# Patient Record
Sex: Male | Born: 1955 | Race: White | Hispanic: No | Smoking: Never smoker
Health system: Southern US, Community
[De-identification: ages and names within clinical notes are randomized; demographics above are authoritative.]

## PROBLEM LIST (undated history)

## (undated) DIAGNOSIS — N200 Calculus of kidney: Secondary | ICD-10-CM

## (undated) DIAGNOSIS — D126 Benign neoplasm of colon, unspecified: Secondary | ICD-10-CM

## (undated) DIAGNOSIS — K7460 Unspecified cirrhosis of liver: Secondary | ICD-10-CM

## (undated) HISTORY — DX: Benign neoplasm of colon, unspecified: D12.6

## (undated) HISTORY — DX: Unspecified cirrhosis of liver: K74.60

## (undated) HISTORY — DX: Calculus of kidney: N20.0

## (undated) HISTORY — PX: LITHOTRIPSY: SUR834

## (undated) HISTORY — PX: LIVER BIOPSY: SHX301

## (undated) HISTORY — PX: ROTATOR CUFF REPAIR: SHX139

---

## 2004-11-18 ENCOUNTER — Ambulatory Visit (HOSPITAL_COMMUNITY): Admission: RE | Admit: 2004-11-18 | Discharge: 2004-11-18 | Payer: Self-pay | Admitting: Orthopedic Surgery

## 2004-11-18 ENCOUNTER — Ambulatory Visit (HOSPITAL_BASED_OUTPATIENT_CLINIC_OR_DEPARTMENT_OTHER): Admission: RE | Admit: 2004-11-18 | Discharge: 2004-11-18 | Payer: Self-pay | Admitting: Orthopedic Surgery

## 2009-01-13 ENCOUNTER — Ambulatory Visit (HOSPITAL_BASED_OUTPATIENT_CLINIC_OR_DEPARTMENT_OTHER): Admission: RE | Admit: 2009-01-13 | Discharge: 2009-01-14 | Payer: Self-pay | Admitting: Orthopedic Surgery

## 2009-03-18 ENCOUNTER — Encounter: Admission: RE | Admit: 2009-03-18 | Discharge: 2009-03-18 | Payer: Self-pay | Admitting: Orthopedic Surgery

## 2009-05-26 ENCOUNTER — Encounter: Payer: Self-pay | Admitting: Gastroenterology

## 2009-07-17 ENCOUNTER — Encounter: Payer: Self-pay | Admitting: Gastroenterology

## 2009-07-28 ENCOUNTER — Encounter: Payer: Self-pay | Admitting: Gastroenterology

## 2009-12-02 ENCOUNTER — Encounter (INDEPENDENT_AMBULATORY_CARE_PROVIDER_SITE_OTHER): Payer: Self-pay | Admitting: *Deleted

## 2010-01-13 ENCOUNTER — Ambulatory Visit: Payer: Self-pay | Admitting: Gastroenterology

## 2010-01-13 DIAGNOSIS — K7460 Unspecified cirrhosis of liver: Secondary | ICD-10-CM | POA: Insufficient documentation

## 2010-01-14 LAB — CONVERTED CEMR LAB
ALT: 43 units/L (ref 0–53)
AST: 65 units/L — ABNORMAL HIGH (ref 0–37)
Albumin: 3.3 g/dL — ABNORMAL LOW (ref 3.5–5.2)
BUN: 11 mg/dL (ref 6–23)
Basophils Absolute: 0 10*3/uL (ref 0.0–0.1)
CO2: 27 meq/L (ref 19–32)
Calcium: 9.3 mg/dL (ref 8.4–10.5)
Chloride: 103 meq/L (ref 96–112)
GFR calc non Af Amer: 97.7 mL/min (ref >60)
HCT: 42.2 % (ref 39.0–52.0)
INR: 1.1 — ABNORMAL HIGH (ref 0.8–1.0)
Lymphs Abs: 1.2 10*3/uL (ref 0.7–4.0)
MCV: 93.5 fL (ref 78.0–100.0)
Monocytes Absolute: 0.6 10*3/uL (ref 0.1–1.0)
Platelets: 65 10*3/uL — ABNORMAL LOW (ref 150.0–400.0)
Potassium: 4.3 meq/L (ref 3.5–5.1)
RDW: 14.2 % (ref 11.5–14.6)

## 2010-01-16 LAB — CONVERTED CEMR LAB: Hep A Total Ab: NEGATIVE

## 2010-01-21 ENCOUNTER — Telehealth: Payer: Self-pay | Admitting: Gastroenterology

## 2010-02-02 ENCOUNTER — Ambulatory Visit: Payer: Self-pay | Admitting: Internal Medicine

## 2010-02-08 ENCOUNTER — Encounter: Admission: RE | Admit: 2010-02-08 | Discharge: 2010-02-08 | Payer: Self-pay | Admitting: Gastroenterology

## 2010-02-10 ENCOUNTER — Telehealth (INDEPENDENT_AMBULATORY_CARE_PROVIDER_SITE_OTHER): Payer: Self-pay | Admitting: *Deleted

## 2010-02-12 ENCOUNTER — Telehealth (INDEPENDENT_AMBULATORY_CARE_PROVIDER_SITE_OTHER): Payer: Self-pay | Admitting: *Deleted

## 2010-02-12 ENCOUNTER — Encounter (INDEPENDENT_AMBULATORY_CARE_PROVIDER_SITE_OTHER): Payer: Self-pay | Admitting: *Deleted

## 2010-02-24 ENCOUNTER — Ambulatory Visit: Payer: Self-pay | Admitting: Gastroenterology

## 2010-02-24 ENCOUNTER — Telehealth: Payer: Self-pay | Admitting: Gastroenterology

## 2010-03-03 ENCOUNTER — Ambulatory Visit: Payer: Self-pay | Admitting: Gastroenterology

## 2010-03-19 ENCOUNTER — Telehealth: Payer: Self-pay | Admitting: Gastroenterology

## 2010-03-19 ENCOUNTER — Ambulatory Visit
Admission: RE | Admit: 2010-03-19 | Discharge: 2010-03-19 | Payer: Self-pay | Source: Home / Self Care | Attending: Gastroenterology | Admitting: Gastroenterology

## 2010-03-29 ENCOUNTER — Encounter: Payer: Self-pay | Admitting: Gastroenterology

## 2010-04-07 NOTE — Progress Notes (Signed)
  Phone Note Other Incoming   Request: Send information Summary of Call: Records received from [facility]. 4 pages forwarded to Dr. Christella Hartigan for review.

## 2010-04-07 NOTE — Letter (Signed)
Summary: New Patient letter  [facility]  [address] [place], [place] [address]   Phone: [phone]  Fax: [phone]       12/02/2009 MRN: [id number]  SMITH POTENZA [address] [place], [place]  [address]  Dear Mr. BORDONARO,  Welcome to the Gastroenterology Division at [facility]    You are scheduled to see Dr.  Christella Hartigan  on 01/13/10  at 2:15 pm on the 3rd floor at [facility], [address]  We ask that you try to arrive at our office 15 minutes prior to your appointment time to allow for check-in.  We would like you to complete the enclosed self-administered evaluation form prior to your visit and bring it with you on the day of your appointment.  We will review it with you.  Also, please bring a complete list of all your medications or, if you prefer, bring the medication bottles and we will list them.  Please bring your insurance card so that we may make a copy of it.  If your insurance requires a referral to see a specialist, please bring your referral form from your primary care physician.  Co-payments are due at the time of your visit and may be paid by cash, check or credit card.     Your office visit will consist of a consult with your physician (includes a physical exam), any laboratory testing he/she may order, scheduling of any necessary diagnostic testing (e.g. x-ray, ultrasound, CT-scan), and scheduling of a procedure (e.g. Endoscopy, Colonoscopy) if required.  Please allow enough time on your schedule to allow for any/all of these possibilities.    If you cannot keep your appointment, please call [phone] to cancel or reschedule prior to your appointment date.  This allows [place] the opportunity to schedule an appointment for another patient in need of care.  If you do not cancel or reschedule by 5 p.m. the business day prior to your appointment date, you will be charged a $50.00 late cancellation/no-show fee.    Thank you for  choosing [facility] for your medical needs.  We appreciate the opportunity to care for you.  Please visit [place] at our website  to learn more about our practice.                     Sincerely,                                                             The Gastroenterology Division

## 2010-04-07 NOTE — Progress Notes (Signed)
Summary: CAT scan today  Phone Note Call from Patient Call back at Home Phone [phone]   Caller: Patient Call For: Dr. Christella Hartigan Reason for Call: Talk to Nurse Details for Reason: CAT Scan Today Summary of Call: Pt. called stating he could not go to CAT scan scheduled for 10:00 a.m. today as his boss would not let him off work.  Please call to reschedule. Initial call taken by: Schuyler Amor,  January 21, 2010 8:08 AM  Follow-up for Phone Call        pt calling to inform that he has toreschedule the appt at [facility] and he will call after he reschedules Follow-up by: Chales Abrahams CMA Duncan Dull),  January 21, 2010 9:00 AM

## 2010-04-07 NOTE — Letter (Signed)
Summary: Results Letter  [facility]  [address] [place], [place] [address]   Phone: [phone]  Fax: [phone]        February 12, 2010 MRN: [id number]    Bradley Green  [birth year] [address] [place], [place]  [address] [phone]   To whom it may concern:  Please send slides from Liver Biopsy MRN [id number]  Dated 05/26/09 to  [facility] Dept of Pathology [address] [place] [address] ATTN [place] Rund MD.  Please include all lab testing available with core biopsy.  [phone] is the contact phone number with the pathology dept.  Any questions please call Dr Rob Bunting office at [phone] and ask for Patty.  Thank you for your help.        Sincerely,  Bradley Green CMA (AAMA)  This letter has been electronically signed by your physician.  Appended Document: Results Letter faxed to [facility] [phone]

## 2010-04-07 NOTE — Procedures (Signed)
Summary: Colonoscopy & EGD/[facility]  Colonoscopy & EGD/[facility]   Imported By: Sherian Rein 01/16/2010 14:29:15  _____________________________________________________________________  External Attachment:    Type:   Image     Comment:   External Document

## 2010-04-07 NOTE — Letter (Signed)
Summary: [facility]  [facility]   Imported By: Sherian Rein 01/16/2010 14:26:59  _____________________________________________________________________  External Attachment:    Type:   Image     Comment:   External Document

## 2010-04-07 NOTE — Assessment & Plan Note (Signed)
History of Present Illness Visit Type: Initial Visit Primary GI MD: Rob Bunting MD Primary Provider: Dustin Flock ([place], [place]) Chief Complaint: Patient here to discuss liver disaease. He c/o some sharp upper abdominal pain. History of Present Illness:      found to have low platelets, less than 50K or so. Was having kidney stone problems.  Stone retrieval eventually by a urologist.  Was told at some point that he was either bleeding or had a cancer.    Underwent a liver bx: 2011 [facility] in [place].  Complicated by chest pains.  The liver biopsy suggested cirrhosis.    Never heard of liver troubles previously.  Never jaundice.  Had hepatitis many years ago (20 years ago blood donation was declined).    Was a drug, alcohol abuser (methampetamines).  In 30's used to drink quite a lot.  Cut back dramatically 20 years ago.  Never used IV drugs.  Tatoo's 10 years ago.  Never had blood transfusion.  labs may 2011: alpha-fetoprotein 3.3, anti-smooth muscle antibody normal, ceruloplasmin normal, alpha-1 antitrypsin level normal, ANA he positive ( speckled pattern) 1:160 titer, INR normal hepatitis A. he antibody IgM negative, hepatitis B. surface antigen negative, hepatitis B core antibody negative, hepatitis C virus antibody negative. AST 99, ALT 53, alkaline phosphatase 159, bilirubin normal. Hemoglobin 14.7, platelets 81,000.  EGD may 2011 was normal, no varices. Colonoscopy may 2011 found 3 small polyps, 2 of them were adenomas.             Current Medications (verified): 1)  Glucotrol 10 Mg Tabs (Glipizide) .Marland Kitchen.. 1 By Mouth Two Times A Day 2)  Lantus 100 Unit/ml Soln (Insulin Glargine) .Marland Kitchen.. 10 Units Every Morning 3)  Humalog 100 Unit/ml Soln (Insulin Lispro (Human)) .... Sliding Scale 4)  Aspirin 81 Mg Tbec (Aspirin) .... Take 1 Tablet By Mouth Once A Day  Allergies (verified): 1)  ! Morphine 2)  ! Darvocet 3)  ! Codeine  Past History:  Past  Medical History: Kidney Stones Diabetes Cirrhosis based on liver biopsy 2011, unclear etiology to date Adenomatous colon polyps May 2011  Past Surgical History: Liver Biopsy 2011 Rotator Cuff Repair kidney stone lithotripsy  Family History: no colon cancer  Social History: he has one son, he works as a Curator, he does not smoke cigarettes, he no longer drinks alcohol, he drinks 4 caffeinated beverages per day.  Review of Systems       Pertinent positive and negative review of systems were noted in the above HPI and GI specific review of systems.  All other review of systems was otherwise negative.   Vital Signs:  Patient profile:   55 year old male Height:      67 inches Weight:      220.25 pounds BMI:     34.62 BSA:     2.11 Pulse rate:   72 / minute Pulse rhythm:   regular BP sitting:   100 / 64  (left arm)  Vitals Entered By: Lamona Curl CMA Duncan Dull) (January 13, 2010 1:20 PM)  Physical Exam  Additional Exam:  Constitutional: generally well appearing Psychiatric: alert and oriented times 3 Eyes: extraocular movements intact Mouth: oropharynx moist, no lesions Neck: supple, no lymphadenopathy Cardiovascular: heart regular rate and rythm Lungs: CTA bilaterally Abdomen: soft, non-tender, non-distended, no obvious ascites, no peritoneal signs, normal bowel sounds Extremities: no lower extremity edema bilaterally Skin: no lesions on visible extremities    Impression & Recommendations:  Problem #  1:  Chronic liver disease we will get liver biopsy report sent over for review, may need to have the slides sent here for our pathologist to review. He has ANA titer 1:160 speckled pattern, possibly underlying autoimmune hepatitis. Otherwise no clear etiology. I've gone over general guideline recommendations for cirrhosis, see those summarized below. He has not had imaging studies in the past year that I can tell so we will set him up with an abdominal CT scan check  for cirrhosis, discrete liver lesions. He return here in 5-6 weeks to discuss all results. We will also begin immunizations for hepatitis A and B.  Problem # 2:  precancerous colon polyps repeat colonoscopy at five-year interval.  Other Orders: CT Abdomen/Pelvis w & w/o Contrast (CT A/P w&w/o Cont) TLB-CMP (Comprehensive Metabolic Pnl) (80053-COMP) TLB-PT (Protime) (85610-PTP) TLB-CBC Platelet - w/Differential (85025-CBCD)  Patient Instructions: 1)  We will get report from liver biopsy done earlier this year [facility] [place]).  May need to have the slides sent here for pathology review. 2)  You will be scheduled for a CT scan of abdomen and pelvis with IV and oral contrast to check for liver cirrhosis, discrete liver lesions. 3)  You will get lab test(s) done today (cmet, inr, cbc) 4)  Avoid NSAID otc pain meds. 5)  Tylenol is safe, only at 1/2 the usual recommended doses. 6)  Low salt diet is best. 7)  Avoid alcohol as best that you can. 8)  Immunization for Hepatitis A and B series of shots started today. 9)  Return to see Dr. Christella Hartigan in 5-6 weeks. 10)  Recall colonoscopy in May 2016 for adenomatous polyps. 11)  A copy of this information will be sent to Dr. Livingston Diones. 12)  The medication list was reviewed and reconciled.  All changed / newly prescribed medications were explained.  A complete medication list was provided to the patient / caregiver.  Appended Document: Orders Update/lab    Clinical Lists Changes  Orders: Added new Test order of T-Hepatitis A Antibody ([id number]) - Signed      Appended Document: recall colon    Clinical Lists Changes  Observations: Added new observation of COLONNXTDUE: 07/2014 (01/13/2010 14:08)      Appended Document:  Verbal order to NOT start Twinrix today will start if needed after hep a antibody  Appended Document:  iron studies (ferritin, tibc, total iron were all normal 2011)  Appended Document:  actually had 3  adenomatous polyps, needs recall colonoscopy May 2014  Appended Document: recall colon correction    Clinical Lists Changes  Observations: Added new observation of COLONNXTDUE: 07/2012 (01/13/2010 14:36) Removed observation of COLONNXTDUE: 07/2014 (01/13/2010 14:08)      Appended Document:  recall corrected

## 2010-04-07 NOTE — Progress Notes (Signed)
Summary: Pathology slides request  Phone Note Outgoing Call   Call placed by: Chales Abrahams CMA Duncan Dull),  February 12, 2010 2:27 PM Summary of Call: placed a call to [facility]ology to have slides sent to [facility] I spoke with Lyda Jester and he advised to fax request with information where to send.  I will get that faxed today.  [phone] fax   Initial call taken by: Chales Abrahams CMA Duncan Dull),  February 12, 2010 2:29 PM

## 2010-04-09 NOTE — Progress Notes (Signed)
Summary: Slides  Phone Note Outgoing Call   Summary of Call: call placd to Karena Addison at [facility] path and the slides will be reviewed tomorrow by Dr Frederica Kuster Initial call taken by: Chales Abrahams CMA Duncan Dull),  February 24, 2010 2:18 PM  Follow-up for Phone Call        ok Follow-up by: Rachael Fee MD,  February 25, 2010 7:36 AM     Appended Document: Slides SP-Surgical Pathology - STATUS: Final  .                                         Perform Date: 20Dec11 00:01  Ordered By: Hoy Finlay MD , PROVIDER         Ordered Date:  Facility: [facility]                              Department: CPATH  Service Report Text  [facility]   [address]   [place] , [place] [address]   Telephone : [phone] Fax : [phone]    REPORT OF SURGICAL PATHOLOGY    Accession #: [id number]   Patient Name: GENESIS, PAGET   Visit # :    MRN: [id number]   Physician: Rob Bunting   DOB/Age 10/08/1955 (Age: 55) Gender: M   Collected Date: 02/24/2010   Received Date: 02/24/2010    FINAL DIAGNOSIS    1. 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 Outside H and E and special stain slides and the H and E slides, trichrome,   PAS and Iron stains preformed here at [facility] are   reviewed. On review, there is macrovesicular steatosis occupying approximately   70% of the hepatic parenchyma with associated ballooned hepatocytes and focal   neutrophilic  inflammation. There is occasional Mallory hyalin deposition.   Trichrome stain identifies extensive perisinusoidal fibrosis associated with the   steatosis, extensive bridging fibrosis and regenerative micro-nodular hepatocyte   formation. Reticulin stain demonstrates disruption of the reticulin network but   no increased hepatocyte cell plate thickness. There is a rare portal area   identified. Within the areas of fibrosis, there is an extensive cholangioloar   proliferation associated with moderate mixed inflammation consisting of   lymphocytes, neutrophils and rare plasma cells and eosinophils. There is focal   to patchy interface inflammation present. No central veins are identified within   the regenerative hepatocyte nodules. There are scattered inflammatory foci   present in the regenerative nodules. Given the history of ethanol usage, and   absence of significant serologic findings, the overall clinicopathologic   features are that of steatohepatitis associated with cirrhotic type fibrosis and   not autoimmune hepatitis. The isolated elevated ANA is highly nonspecific and   is known to occur with steatohepatitis. The case was discussed with Dr. Christella Hartigan   on 02/25/2010. (  CR:kv 02/26/10)    CASE COMMENTS   STAINS USED IN DIAGNOSIS:   Iron Stain   Masson's Trichrome Stain   PAS/H w/Dig    CLINICAL HISTORY    SPECIMEN(S) OBTAINED   1. Consult- Comprehensive, Liver, Needle Biopsy    SPECIMEN COMMENTS:   SPECIMEN CLINICAL INFORMATION:   1. [facility] Pathology # [id number] (isv)    Gross Description  1. Received from [facility] are 8 slides and 1 block labeled   [id number] accompanied by a corresponding report. The report indicates the   specimen represents Liver Biopsy on the above patient. The slides and block are   returned after completion of our studies. (CRR,isv)    Report signed out from the following location(s)   [facility]   [facility]   [address] [place], [place] [address] CLIA #: [id number]    [facility]   [address] [place], [place] [address] CLIA #: [id number]  Additional Information  HL7 RESULT STATUS : F  External IF Update Timestamp : 2010-02-26:16:24:00.000000  Appended Document: Slides i talked with him about biopsy path second opinion.  unlikely that he has underlying AIH, he was drinking (but very sporadically) at the time of the biopsy last march.

## 2010-04-09 NOTE — Assessment & Plan Note (Signed)
Summary: Hoy Register #3/KS  Nurse Visit   Allergies: 1)  ! Morphine 2)  ! Darvocet 3)  ! Codeine  Immunizations Administered:  TwinRix # 3:    Vaccine Type: TwinRix    Site: right deltoid    Mfr: GlaxoSmithKline    Dose: 1.0 ml    Route: IM    Given by: Christie Nottingham CMA (AAMA)    Exp. Date: 01/09/2012    Lot #: [id number]    VIS given: 11/24/06 version given March 19, 2010. Pt will be due for next Twinrix injection in one year. Sent a flag to Patty for pt to have booster in one year.

## 2010-04-09 NOTE — Assessment & Plan Note (Signed)
Summary: twin rix #2 /pl  Nurse Visit   Allergies: 1)  ! Morphine 2)  ! Darvocet 3)  ! Codeine  Immunizations Administered:  TwinRix # 2:    Vaccine Type: TwinRix    Site: left deltoid    Mfr: GlaxoSmithKline    Dose: 1.0 ml    Route: IM    Given by: Ok Anis CMA    Exp. Date: 01/09/2012    Lot #: [id number]    VIS given: 11/24/06 version given March 03, 2010.  Orders Added: 1)  TwinRix 1ml ( Hep A&B Adult dose) [90636] 2)  Admin 1st Vaccine [90471]

## 2010-04-09 NOTE — Assessment & Plan Note (Signed)
Review of gastrointestinal problems: 1. cirrhosis (unclear cause 12/11, possible ANA, possible alcohol) pha-fetoprotein 3.3, anti-smooth muscle antibody normal, ceruloplasmin normal, alpha-1 antitrypsin level normal, ANA he positive ( speckled pattern) 1:160 titer, INR normal hepatitis A. he antibody IgM negative, hepatitis B. surface antigen negative, hepatitis B core antibody negative, hepatitis C virus antibody negative. AST 99, ALT 53, alkaline phosphatase 159, bilirubin normal. Hemoglobin 14.7, platelets 81,000.  EGD may 2011 was normal, no varices.  colonoscopy may 2011 found 3 small polyps, 2 of them were adenomas. CT 11/11 cirrhosis, (Several small lesions that were followup by MRI 11/11 and this suggested regenerative nodules onlym, no clear hepatocellular cancer)   History of Present Illness Visit Type: Follow-up Visit Primary GI MD: Rob Bunting MD Primary Provider: Dustin Flock ([place], [place]) Requesting Provider: n/a Chief Complaint: 6 week follow up History of Present Illness:     very pleasant 55 year old mansince last visit about 6 weeks ago, he has been worried. But overall doing well.  he passed a kidney stone this past weekend (not uncommon for him).   he had CT scan and MRI, see those results summarized above. We have not yet received second opinion pathology from his liver biopsy.  no overt Gi bleeding, weight stable.             Current Medications (verified): 1)  Glucotrol 10 Mg Tabs (Glipizide) .Marland Kitchen.. 1 By Mouth Two Times A Day 2)  Lantus 100 Unit/ml Soln (Insulin Glargine) .Marland Kitchen.. 10 Units Every Morning 3)  Humalog 100 Unit/ml Soln (Insulin Lispro (Human)) .... Sliding Scale 4)  Aspirin 81 Mg Tbec (Aspirin) .... Take 1 Tablet By Mouth Once A Day 5)  Hctz 5mg  .... 1 By Mouth Every Am  Allergies (verified): 1)  ! Morphine 2)  ! Darvocet 3)  ! Codeine  Vital Signs:  Patient profile:   55 year old male Height:      67 inches Weight:      222  pounds BMI:     34.90 BSA:     2.12 Pulse rate:   74 / minute Pulse rhythm:   regular BP sitting:   108 / 80  (left arm)  Vitals Entered By: Merri Ray CMA Duncan Dull) (February 24, 2010 1:44 PM)  Physical Exam  Additional Exam:  Constitutional: generally well appearing Psychiatric: alert and oriented times 3 Abdomen: soft, non-tender, non-distended, normal bowel sounds, no obvious ascites Extremities no lower extremity edema   Impression & Recommendations:  Problem # 1:  CIRRHOSIS (ICD-571.5) unclear etiology, alcohol or autoimmune liver disease. We will again try to get second opinion pathology on his previous liver biopsy. He has not had alcohol in many years her he if the biopsies however suggest an autoimmune process we could start him on steroids and immunomodulators. He does have low platelets, no varices on outside EGD, otherwise he has well compensated cirrhosis. He will return to see me in 6 months and sooner if needed.  Patient Instructions: 1)  We will check for pathology about the slides we requested from previous liver biopsy.  2)  Will start Hep A/B immunization series today. 3)  Avoid NSAIDs. 4)  Should be on a low salt diet. 5)  Return to see Dr. Christella Hartigan in 6 months, sooner if needed. 6)  A copy of this information will be sent to Dr. Heriberto Antigua ([place]). 7)  The medication list was reviewed and reconciled.  All changed / newly prescribed medications were explained.  A complete medication list was provided  to the patient / caregiver.  Appended Document: Twin Rix #1    Clinical Lists Changes  Orders: Added new Service order of TwinRix 1ml ( Hep A&B Adult dose) (40981) - Signed Added new Service order of Admin 1st Vaccine (19147) - Signed Observations: Added new observation of WGNFAOZ3 VIS: 11/24/06 version given February 24, 2010. (02/24/2010 14:14) Added new observation of TWINRIX1LOT#: [id number] (02/24/2010 14:14) Added new observation of TWINRIX1EXP:  01/09/2012 (02/24/2010 14:14) Added new observation of TWINRIX1BY: Chales Abrahams CMA (AAMA) (02/24/2010 14:14) Added new observation of EXBMWUX3KGMW: IM (02/24/2010 14:14) Added new observation of TWINRIX1DOSE: 1.0 ml (02/24/2010 14:14) Added new observation of NUUVOZD6 MFR: GlaxoSmithKline (02/24/2010 14:14) Added new observation of TWINRIX1SITE: left deltoid (02/24/2010 14:14) Added new observation of TWINRIX1GIVN: TwinRix (02/24/2010 14:14)       Immunizations Administered:  TwinRix # 1:    Vaccine Type: TwinRix    Site: left deltoid    Mfr: GlaxoSmithKline    Dose: 1.0 ml    Route: IM    Given by: Chales Abrahams CMA (AAMA)    Exp. Date: 01/09/2012    Lot #: [id number]    VIS given: 11/24/06 version given February 24, 2010.

## 2010-04-09 NOTE — Progress Notes (Signed)
Summary: Questions regarding liver disease  Phone Note Call from Patient   Caller: Pt was here in office for Twinrix #3 inj. Call For: Dr. Christella Hartigan Summary of Call: Pt arrived today for his third Twinrix injection which was given without difficulty. Pt has some questions for the doctor. Pt states he has accepted that he has liver disease but he would like to know looking how far advanced his liver disease is, do you know what percentage his liver is still functioning. Told pt that I wasn't sure if that a answer we could give him but I would ask Dr. Christella Hartigan anyway and call him back.  Initial call taken by: Christie Nottingham CMA Duncan Dull),  March 19, 2010 8:44 AM  Follow-up for Phone Call        the majority (>50%) of his liver probably still functions. Follow-up by: Rachael Fee MD,  March 30, 2010 12:07 PM  Additional Follow-up for Phone Call Additional follow up Details #1::        Pt notified and pt verbalized understanding.  Additional Follow-up by: Christie Nottingham CMA Duncan Dull),  March 30, 2010 1:23 PM

## 2010-06-10 LAB — GLUCOSE, CAPILLARY: Glucose-Capillary: 101 mg/dL — ABNORMAL HIGH (ref 70–99)

## 2010-06-10 LAB — BASIC METABOLIC PANEL
CO2: 27 mEq/L (ref 19–32)
Calcium: 9.4 mg/dL (ref 8.4–10.5)
Chloride: 106 mEq/L (ref 96–112)
GFR calc Af Amer: >60 mL/min (ref >60)
Potassium: 3.9 mEq/L (ref 3.5–5.1)
Sodium: 140 mEq/L (ref 135–145)

## 2010-06-10 LAB — POCT HEMOGLOBIN-HEMACUE: Hemoglobin: 14.5 g/dL (ref 13.0–17.0)

## 2010-07-24 NOTE — Op Note (Signed)
NAME:  Bradley Green, Bradley Green NO.:  [id number]   MEDICAL RECORD NO.:  [id number]          PATIENT TYPE:  AMB   LOCATION:  DSC                          FACILITY:  [facility]   PHYSICIAN:  Feliberto Gottron. Turner Daniels, M.D.   DATE OF BIRTH:  1955/07/31   DATE OF PROCEDURE:  11/18/2004  DATE OF DISCHARGE:                                 OPERATIVE REPORT   PREOPERATIVE DIAGNOSIS:  Right shoulder impingement syndrome.   POSTOPERATIVE DIAGNOSIS:  Right shoulder impingement syndrome.   PROCEDURE:  Right shoulder subacromial bursectomy, anteroinferior  acromioplasty and distal clavicle spur excision.   SURGEON:  Feliberto Gottron. Turner Daniels, MD   FIRST ASSISTANT:  None.   ANESTHETIC:  Interscalene block with general endotracheal.   ESTIMATED BLOOD LOSS:  Minimal.   FLUID REPLACEMENT:  800 mL of crystalloid.   DRAINS PLACED:  None.   TOURNIQUET TIME:  None.   INDICATIONS FOR PROCEDURE:  [age]-year-old gentleman who injured his  right shoulder a number of months ago.  He has had persistent pain and  impingement syndrome.  MRI scan showed rotator cuff irritation, but no  rotator cuff tear.  He has failed conservative measures and desires elective  arthroscopic decompression and evaluation of his shoulder.  Risks and  benefits of surgery were discussed prior to the procedure and all questions  answered.   DESCRIPTION OF PROCEDURE:  Patient identified by armband and taken the  operating room at [facility].  Appropriate anesthetic monitors  were attached and interscalene block anesthesia induced into the right upper  extremity followed by general endotracheal anesthesia.  The patient was then  placed in the beach-chair position and the right upper extremity prepped and  draped in usual sterile fashion from the wrist to the hemithorax.  Using a  #11 blade, standard portals were then made 1.5 cm anterior the [facility] joint,  lateral to the junction of middle and posterior thirds of the acromion  and  posterior the posterolateral corner of the acromion process.  The inflow  cannula was placed anteriorly, the arthroscope laterally and a 4.2 great  white sucker shaver posteriorly.  One assistant applied axial traction on  the arm, subacromial bursectomy was accomplished, small bleeders identified  and cauterized with a hook Bovie and the subacromial spur, which was not  very well-visualized on the plain x-ray, was outlined arthroscopically.  We  then used a 4.5 hooded Vortex bur to resect the subacromial spur as well as  the subclavicular spur and then evaluated the rotator cuff and although was  somewhat roughened up inflamed, it was intact.  The arthroscope was then  repositioned into the glenohumeral joint from the posterior portal.  Once  again, the rotator cuff just below where the spur had been was inflamed and  red, but the insertion was intact, as was the biceps tendon and anchor and  the labrum.  The articular cartilage was in excellent condition and there  was no chondromalacia noted.  At this point the shoulder was irrigated out  with normal saline solution, the arthroscopic instruments removed and a  dressing of Xeroform, 4  x 4 dressing sponges, Hypafix tape and a sling  applied.  The patient was then awakened and taken to the recovery room  without difficulty.      Feliberto Gottron. Turner Daniels, M.D.  Electronically Signed     FJR/MEDQ  D:  11/18/2004  T:  11/19/2004  Job:  [id number]

## 2010-09-11 ENCOUNTER — Encounter: Payer: Self-pay | Admitting: Gastroenterology

## 2010-11-10 ENCOUNTER — Ambulatory Visit: Payer: Self-pay | Admitting: Gastroenterology

## 2011-03-11 ENCOUNTER — Telehealth: Payer: Self-pay

## 2011-03-11 NOTE — Telephone Encounter (Signed)
Pt scheduled for his last Twin Rix 03/24/11  He has been notified

## 2011-03-11 NOTE — Telephone Encounter (Signed)
Message copied by Donata Duff on Thu Mar 11, 2011 10:14 AM ------      Message from: Donata Duff      Created: Tue May 12, 2010  2:03 PM       Twin rix due 03/20/11

## 2011-03-24 ENCOUNTER — Telehealth: Payer: Self-pay | Admitting: Gastroenterology

## 2011-03-24 NOTE — Telephone Encounter (Signed)
Pt can not make TWIN rix appt today he will get the injection at his 03/29/11 ROV.

## 2011-03-29 ENCOUNTER — Other Ambulatory Visit (INDEPENDENT_AMBULATORY_CARE_PROVIDER_SITE_OTHER): Payer: BC Managed Care – PPO

## 2011-03-29 ENCOUNTER — Encounter: Payer: Self-pay | Admitting: Gastroenterology

## 2011-03-29 ENCOUNTER — Ambulatory Visit (INDEPENDENT_AMBULATORY_CARE_PROVIDER_SITE_OTHER): Payer: BC Managed Care – PPO | Admitting: Gastroenterology

## 2011-03-29 VITALS — BP 120/80 | HR 75 | Ht 67.0 in | Wt 207.6 lb

## 2011-03-29 DIAGNOSIS — Z23 Encounter for immunization: Secondary | ICD-10-CM

## 2011-03-29 DIAGNOSIS — K7460 Unspecified cirrhosis of liver: Secondary | ICD-10-CM

## 2011-03-29 LAB — CBC WITH DIFFERENTIAL/PLATELET
Basophils Relative: 0.2 % (ref 0.0–3.0)
Eosinophils Absolute: 0.1 10*3/uL (ref 0.0–0.7)
Eosinophils Relative: 2.2 % (ref 0.0–5.0)
Lymphocytes Relative: 31.6 % (ref 12.0–46.0)
MCHC: 35.5 g/dL (ref 30.0–36.0)
MCV: 91.1 fl (ref 78.0–100.0)
Monocytes Absolute: 0.4 10*3/uL (ref 0.1–1.0)
Neutrophils Relative %: 55.6 % (ref 43.0–77.0)
Platelets: 38 10*3/uL — CL (ref 150.0–400.0)
RBC: 4.49 Mil/uL (ref 4.22–5.81)
WBC: 3.7 10*3/uL — ABNORMAL LOW (ref 4.5–10.5)

## 2011-03-29 LAB — COMPREHENSIVE METABOLIC PANEL
Albumin: 3.3 g/dL — ABNORMAL LOW (ref 3.5–5.2)
BUN: 11 mg/dL (ref 6–23)
CO2: 28 mEq/L (ref 19–32)
Calcium: 8.9 mg/dL (ref 8.4–10.5)
Chloride: 97 mEq/L (ref 96–112)
Glucose, Bld: 479 mg/dL — ABNORMAL HIGH (ref 70–99)
Potassium: 4 mEq/L (ref 3.5–5.1)
Sodium: 134 mEq/L — ABNORMAL LOW (ref 135–145)
Total Protein: 7 g/dL (ref 6.0–8.3)

## 2011-03-29 LAB — PROTIME-INR
INR: 1.1 ratio — ABNORMAL HIGH (ref 0.8–1.0)
Prothrombin Time: 12 s (ref 10.2–12.4)

## 2011-03-29 NOTE — Patient Instructions (Addendum)
You have been given a separate informational sheet regarding your tobacco use, the importance of quitting and local resources to help you quit. You will have labs checked today in the basement lab.  Please head down after you check out with the front desk  (cbc, cmet, inr, AFP). MRI liver to screen for hepatoma.  [facility] on 03/31/11 arrive at  6:45 pm and have nothing to eat after 3 pm. It is important that you have a relatively low salt diet.  High salt diet can cause fluid to accumulate in your legs, abdomen and even around your lungs. You should try to avoid NSAID type over the counter pain medicines as best as possible. Tylenol is safe to take for 'routine' aches and pains, but never take more than 1/2 the dose suggested on the package instructions (never more than 2 grams per day). Avoid alcohol.

## 2011-03-29 NOTE — Progress Notes (Signed)
Review of gastrointestinal problems:  1. cirrhosis (unclear cause 12/11, possible ANA, possible alcohol)  pha-fetoprotein 3.3, anti-smooth muscle antibody normal, ceruloplasmin normal, alpha-1 antitrypsin level normal, ANA he positive ( speckled pattern) 1:160 titer, INR normal hepatitis A. he antibody IgM negative, hepatitis B. surface antigen negative, hepatitis B core antibody negative, hepatitis C virus antibody negative. AST 99, ALT 53, alkaline phosphatase 159, bilirubin normal. Hemoglobin 14.7, platelets 81,000.   Liver biopsy [place], [place].  Second opinion by [place] [facility] in [facility]: Clear cirrhosis, overall morphology not consistent with autoimmune hepatitis. More likely steatohepatitis related, possibly alcohol related.  EGD may 2011 was normal, no varices.   colonoscopy may 2011 found 3 small polyps, 2 of them were adenomas.   CT 11/11 cirrhosis, (Several small lesions that were followup by MRI 11/11 and this suggested regenerative nodules onlym, no clear hepatocellular cancer)  Immunization for Hep A/B completed (1/13)   HPI: This is a  very pleasant 56 year old man whom I last saw about a year ago. He has had no overt GI bleeding. He has had some slight swelling in his ankles. He avoids NSAIDs. He does drink quite a lot of extra water every day saying that he will drink at least one bottle of water before leaving his house in the morning, 3-4 bottles of water at work.  He received his last of the hepatitis A., B. immunization series today    Past Medical History  Diagnosis Date  . Kidney stones   . Diabetes mellitus   . Cirrhosis   . Adenomatous colon polyp     Past Surgical History  Procedure Date  . Liver biopsy   . Rotator cuff repair   . Lithotripsy     Current Outpatient Prescriptions  Medication Sig Dispense Refill  . aspirin 81 MG tablet Take 81 mg by mouth daily.        . insulin glargine (LANTUS) 100 UNIT/ML injection Inject 40 Units into the skin 2  (two) times daily.       . insulin lispro (HUMALOG) 100 UNIT/ML injection Inject into the skin. Sliding scale         Allergies as of 03/29/2011 - Review Complete 03/29/2011  Allergen Reaction Noted  . Codeine  01/13/2010  . Morphine  01/13/2010  . Pork-derived products Hives 03/29/2011  . Propoxyphene n-acetaminophen  01/13/2010    Family History  Problem Relation Age of Onset  . Colon cancer Neg Hx     History   Social History  . Marital Status: Divorced    Spouse Name: N/A    Number of Children: 1  . Years of Education: N/A   Occupational History  . Curator    Social History Main Topics  . Smoking status: Never Smoker   . Smokeless tobacco: Current User    Types: Chew   Comment: Info given on 03-29-11  . Alcohol Use: No  . Drug Use: No  . Sexually Active: Not on file   Other Topics Concern  . Not on file   Social History Narrative  . No narrative on file      Physical Exam: BP 120/80  Pulse 75  Ht 5\' 7"  (1.702 m)  Wt 207 lb 9.6 oz (94.167 kg)  BMI 32.51 kg/m2 Constitutional: generally well-appearing Psychiatric: alert and oriented x3 Abdomen: soft, nontender, nondistended, no obvious ascites, no peritoneal signs, normal bowel sounds Lower extremities: 1+ pitting edema bilaterally    Assessment and plan: 56 y.o. male with cirrhosis, well compensated  He does have some slight edema in his ankles currently. This may be from all the extra water that he drinks. I recommended he try to cut back on that. He is also going to get a basic set of labs today including a complete metabolic profile, CBC, coags, alpha fetoprotein. He is also due for MRI to screen him for hepatoma.

## 2011-03-30 LAB — AFP TUMOR MARKER: AFP-Tumor Marker: 3.3 ng/mL (ref 0.0–8.0)

## 2011-03-31 ENCOUNTER — Ambulatory Visit (HOSPITAL_COMMUNITY)
Admission: RE | Admit: 2011-03-31 | Discharge: 2011-03-31 | Disposition: A | Discharge status: Home / Self Care | Payer: BC Managed Care – PPO | Source: Ambulatory Visit | Attending: Gastroenterology | Admitting: Gastroenterology

## 2011-03-31 DIAGNOSIS — Z23 Encounter for immunization: Secondary | ICD-10-CM

## 2011-03-31 DIAGNOSIS — K7460 Unspecified cirrhosis of liver: Secondary | ICD-10-CM

## 2011-04-01 ENCOUNTER — Telehealth: Payer: Self-pay | Admitting: Gastroenterology

## 2011-04-01 MED ORDER — DIAZEPAM 5 MG PO TABS: 5.0000 mg | ORAL_TABLET | Freq: Once | ORAL | Status: AC

## 2011-04-01 NOTE — Telephone Encounter (Signed)
Pt aware and will call with any further concerns 

## 2011-04-01 NOTE — Telephone Encounter (Signed)
Dr Christella Hartigan can the pt have something to take before the MRI? I have rescheduled to [facility] for 04/04/11

## 2011-04-01 NOTE — Telephone Encounter (Signed)
Left message on machine to call back prescription faxed

## 2011-04-01 NOTE — Telephone Encounter (Signed)
Yes, valium 5mg  pill, once, call in 2 pills, no refills  Thanks

## 2011-04-04 ENCOUNTER — Ambulatory Visit
Admission: RE | Admit: 2011-04-04 | Discharge: 2011-04-04 | Disposition: A | Discharge status: Home / Self Care | Payer: BC Managed Care – PPO | Source: Ambulatory Visit | Attending: Gastroenterology | Admitting: Gastroenterology

## 2011-04-04 ENCOUNTER — Ambulatory Visit: Admission: RE | Admit: 2011-04-04 | Payer: BC Managed Care – PPO | Source: Ambulatory Visit

## 2011-04-04 ENCOUNTER — Other Ambulatory Visit: Payer: Self-pay | Admitting: Gastroenterology

## 2011-04-04 DIAGNOSIS — Z23 Encounter for immunization: Secondary | ICD-10-CM

## 2011-04-04 DIAGNOSIS — C220 Liver cell carcinoma: Secondary | ICD-10-CM

## 2011-04-04 DIAGNOSIS — K7460 Unspecified cirrhosis of liver: Secondary | ICD-10-CM

## 2011-04-04 MED ORDER — GADOBENATE DIMEGLUMINE 529 MG/ML IV SOLN
19.0000 mL | Freq: Once | INTRAVENOUS | Status: AC | PRN
  Administered 2011-04-04: 19 mL via INTRAVENOUS

## 2011-09-01 ENCOUNTER — Encounter: Payer: Self-pay | Admitting: Gastroenterology

## 2012-04-19 ENCOUNTER — Ambulatory Visit: Payer: BC Managed Care – PPO | Admitting: Gastroenterology

## 2012-06-01 ENCOUNTER — Telehealth: Payer: Self-pay | Admitting: Gastroenterology

## 2012-06-01 NOTE — Telephone Encounter (Signed)
Message copied by Arna Snipe on Thu Jun 01, 2012  3:47 PM ------      Message from: Donata Duff      Created: Wed Apr 19, 2012  9:27 AM       Do not bill ------

## 2012-06-15 ENCOUNTER — Encounter: Payer: Self-pay | Admitting: Gastroenterology

## 2012-06-20 ENCOUNTER — Encounter: Payer: Self-pay | Admitting: Gastroenterology

## 2012-10-11 ENCOUNTER — Ambulatory Visit: Payer: BC Managed Care – PPO | Admitting: Gastroenterology

## 2012-11-22 ENCOUNTER — Ambulatory Visit: Payer: BC Managed Care – PPO | Admitting: Gastroenterology

## 2013-01-09 ENCOUNTER — Other Ambulatory Visit: Payer: Self-pay | Admitting: Orthopedic Surgery

## 2013-01-09 DIAGNOSIS — M25561 Pain in right knee: Secondary | ICD-10-CM

## 2013-01-20 ENCOUNTER — Ambulatory Visit
Admission: RE | Admit: 2013-01-20 | Discharge: 2013-01-20 | Disposition: A | Discharge status: Home / Self Care | Payer: BC Managed Care – PPO | Source: Ambulatory Visit | Attending: Orthopedic Surgery | Admitting: Orthopedic Surgery

## 2013-01-20 DIAGNOSIS — M25561 Pain in right knee: Secondary | ICD-10-CM

## 2013-08-05 ENCOUNTER — Encounter: Payer: Self-pay | Admitting: Gastroenterology

## 2013-10-22 ENCOUNTER — Emergency Department (HOSPITAL_COMMUNITY): Payer: Self-pay

## 2013-10-22 ENCOUNTER — Inpatient Hospital Stay (HOSPITAL_COMMUNITY)
Admission: EM | Admit: 2013-10-22 | Discharge: 2013-10-24 | DRG: 417 | Disposition: A | Discharge status: Home / Self Care | Payer: Self-pay | Attending: Internal Medicine | Admitting: Internal Medicine

## 2013-10-22 ENCOUNTER — Encounter (HOSPITAL_COMMUNITY): Payer: Self-pay | Admitting: Emergency Medicine

## 2013-10-22 DIAGNOSIS — K8000 Calculus of gallbladder with acute cholecystitis without obstruction: Principal | ICD-10-CM | POA: Diagnosis present

## 2013-10-22 DIAGNOSIS — F17200 Nicotine dependence, unspecified, uncomplicated: Secondary | ICD-10-CM | POA: Diagnosis present

## 2013-10-22 DIAGNOSIS — R1011 Right upper quadrant pain: Secondary | ICD-10-CM

## 2013-10-22 DIAGNOSIS — Z886 Allergy status to analgesic agent status: Secondary | ICD-10-CM

## 2013-10-22 DIAGNOSIS — R112 Nausea with vomiting, unspecified: Secondary | ICD-10-CM

## 2013-10-22 DIAGNOSIS — K7460 Unspecified cirrhosis of liver: Secondary | ICD-10-CM | POA: Diagnosis present

## 2013-10-22 DIAGNOSIS — K810 Acute cholecystitis: Secondary | ICD-10-CM

## 2013-10-22 DIAGNOSIS — N400 Enlarged prostate without lower urinary tract symptoms: Secondary | ICD-10-CM

## 2013-10-22 DIAGNOSIS — Z91018 Allergy to other foods: Secondary | ICD-10-CM

## 2013-10-22 DIAGNOSIS — M316 Other giant cell arteritis: Secondary | ICD-10-CM

## 2013-10-22 DIAGNOSIS — K819 Cholecystitis, unspecified: Secondary | ICD-10-CM | POA: Insufficient documentation

## 2013-10-22 DIAGNOSIS — K7689 Other specified diseases of liver: Secondary | ICD-10-CM | POA: Diagnosis present

## 2013-10-22 DIAGNOSIS — K8010 Calculus of gallbladder with chronic cholecystitis without obstruction: Secondary | ICD-10-CM | POA: Diagnosis present

## 2013-10-22 DIAGNOSIS — K859 Acute pancreatitis without necrosis or infection, unspecified: Secondary | ICD-10-CM | POA: Diagnosis present

## 2013-10-22 DIAGNOSIS — Z885 Allergy status to narcotic agent status: Secondary | ICD-10-CM

## 2013-10-22 DIAGNOSIS — D696 Thrombocytopenia, unspecified: Secondary | ICD-10-CM

## 2013-10-22 DIAGNOSIS — K851 Biliary acute pancreatitis without necrosis or infection: Secondary | ICD-10-CM

## 2013-10-22 DIAGNOSIS — Z794 Long term (current) use of insulin: Secondary | ICD-10-CM

## 2013-10-22 DIAGNOSIS — N200 Calculus of kidney: Secondary | ICD-10-CM

## 2013-10-22 DIAGNOSIS — E119 Type 2 diabetes mellitus without complications: Secondary | ICD-10-CM

## 2013-10-22 DIAGNOSIS — Z23 Encounter for immunization: Secondary | ICD-10-CM

## 2013-10-22 DIAGNOSIS — K766 Portal hypertension: Secondary | ICD-10-CM | POA: Diagnosis present

## 2013-10-22 LAB — I-STAT CHEM 8, ED
BUN: 7 mg/dL (ref 6–23)
Calcium, Ion: 1.17 mmol/L (ref 1.12–1.23)
Chloride: 102 mEq/L (ref 96–112)
Creatinine, Ser: 0.9 mg/dL (ref 0.50–1.35)
Glucose, Bld: 377 mg/dL — ABNORMAL HIGH (ref 70–99)
HCT: 45 % (ref 39.0–52.0)
HEMOGLOBIN: 15.3 g/dL (ref 13.0–17.0)
Potassium: 4 mEq/L (ref 3.7–5.3)
Sodium: 138 mEq/L (ref 137–147)
TCO2: 21 mmol/L (ref 0–100)

## 2013-10-22 LAB — URINALYSIS, ROUTINE W REFLEX MICROSCOPIC
Bilirubin Urine: NEGATIVE
Hgb urine dipstick: NEGATIVE
Ketones, ur: NEGATIVE mg/dL
LEUKOCYTES UA: NEGATIVE
Nitrite: NEGATIVE
PROTEIN: NEGATIVE mg/dL
Specific Gravity, Urine: 1.03 (ref 1.005–1.030)
UROBILINOGEN UA: 0.2 mg/dL (ref 0.0–1.0)
pH: 5.5 (ref 5.0–8.0)

## 2013-10-22 LAB — RAPID URINE DRUG SCREEN, HOSP PERFORMED
Amphetamines: NOT DETECTED
Barbiturates: NOT DETECTED
Benzodiazepines: NOT DETECTED
Cocaine: NOT DETECTED
Opiates: NOT DETECTED
Tetrahydrocannabinol: NOT DETECTED

## 2013-10-22 LAB — COMPREHENSIVE METABOLIC PANEL
ALK PHOS: 180 U/L — AB (ref 39–117)
ALT: 41 U/L (ref 0–53)
AST: 54 U/L — ABNORMAL HIGH (ref 0–37)
Albumin: 3 g/dL — ABNORMAL LOW (ref 3.5–5.2)
Anion gap: 13 (ref 5–15)
BUN: 8 mg/dL (ref 6–23)
CHLORIDE: 103 meq/L (ref 96–112)
CO2: 23 meq/L (ref 19–32)
Calcium: 9.1 mg/dL (ref 8.4–10.5)
Creatinine, Ser: 0.84 mg/dL (ref 0.50–1.35)
GLUCOSE: 352 mg/dL — AB (ref 70–99)
POTASSIUM: 4.2 meq/L (ref 3.7–5.3)
SODIUM: 139 meq/L (ref 137–147)
Total Bilirubin: 0.8 mg/dL (ref 0.3–1.2)
Total Protein: 7.1 g/dL (ref 6.0–8.3)

## 2013-10-22 LAB — URINE MICROSCOPIC-ADD ON

## 2013-10-22 LAB — PROTIME-INR
INR: 1.15 (ref 0.00–1.49)
Prothrombin Time: 14.7 seconds (ref 11.6–15.2)

## 2013-10-22 LAB — CBC WITH DIFFERENTIAL/PLATELET
Basophils Absolute: 0 10*3/uL (ref 0.0–0.1)
Basophils Relative: 0 % (ref 0–1)
Eosinophils Absolute: 0.1 10*3/uL (ref 0.0–0.7)
Eosinophils Relative: 3 % (ref 0–5)
HCT: 42 % (ref 39.0–52.0)
Hemoglobin: 14.6 g/dL (ref 13.0–17.0)
LYMPHS ABS: 0.8 10*3/uL (ref 0.7–4.0)
LYMPHS PCT: 21 % (ref 12–46)
MCH: 29.8 pg (ref 26.0–34.0)
MCHC: 34.8 g/dL (ref 30.0–36.0)
MCV: 85.7 fL (ref 78.0–100.0)
Monocytes Absolute: 0.5 10*3/uL (ref 0.1–1.0)
Monocytes Relative: 13 % — ABNORMAL HIGH (ref 3–12)
NEUTROS ABS: 2.3 10*3/uL (ref 1.7–7.7)
NEUTROS PCT: 63 % (ref 43–77)
PLATELETS: 48 10*3/uL — AB (ref 150–400)
RBC: 4.9 MIL/uL (ref 4.22–5.81)
RDW: 14.6 % (ref 11.5–15.5)
WBC: 3.7 10*3/uL — AB (ref 4.0–10.5)

## 2013-10-22 LAB — CBG MONITORING, ED: GLUCOSE-CAPILLARY: 344 mg/dL — AB (ref 70–99)

## 2013-10-22 LAB — LIPASE, BLOOD: Lipase: 64 U/L — ABNORMAL HIGH (ref 11–59)

## 2013-10-22 MED ORDER — ONDANSETRON HCL 4 MG PO TABS: 4.0000 mg | ORAL_TABLET | Freq: Four times a day (QID) | ORAL | Status: DC | PRN

## 2013-10-22 MED ORDER — ONDANSETRON HCL 4 MG/2ML IJ SOLN
4.0000 mg | Freq: Four times a day (QID) | INTRAMUSCULAR | Status: DC | PRN
  Administered 2013-10-23 – 2013-10-24 (×2): 4 mg via INTRAVENOUS
  Filled 2013-10-22 (×2): qty 2

## 2013-10-22 MED ORDER — SODIUM CHLORIDE 0.9 % IV SOLN
1000.0000 mL | Freq: Once | INTRAVENOUS | Status: AC
  Administered 2013-10-22: 1000 mL via INTRAVENOUS

## 2013-10-22 MED ORDER — HYDROMORPHONE HCL PF 1 MG/ML IJ SOLN
0.5000 mg | INTRAMUSCULAR | Status: DC | PRN
  Administered 2013-10-22 – 2013-10-23 (×5): 1 mg via INTRAVENOUS
  Filled 2013-10-22 (×5): qty 1

## 2013-10-22 MED ORDER — SODIUM CHLORIDE 0.9 % IV SOLN: Freq: Once | INTRAVENOUS | Status: AC

## 2013-10-22 MED ORDER — TAMSULOSIN HCL 0.4 MG PO CAPS: 0.4000 mg | ORAL_CAPSULE | Freq: Every day | ORAL | Status: DC

## 2013-10-22 MED ORDER — INSULIN ASPART 100 UNIT/ML ~~LOC~~ SOLN: 0.0000 [IU] | Freq: Three times a day (TID) | SUBCUTANEOUS | Status: DC

## 2013-10-22 MED ORDER — HYDROMORPHONE HCL PF 1 MG/ML IJ SOLN
0.2500 mg | INTRAMUSCULAR | Status: DC | PRN
  Administered 2013-10-22: 0.25 mg via INTRAVENOUS
  Filled 2013-10-22: qty 1

## 2013-10-22 MED ORDER — HYDROMORPHONE HCL PF 1 MG/ML IJ SOLN
0.5000 mg | Freq: Once | INTRAMUSCULAR | Status: AC
  Administered 2013-10-22: 0.5 mg via INTRAVENOUS
  Filled 2013-10-22: qty 1

## 2013-10-22 MED ORDER — TAMSULOSIN HCL 0.4 MG PO CAPS
0.4000 mg | ORAL_CAPSULE | Freq: Every day | ORAL | Status: DC
  Administered 2013-10-23: 0.4 mg via ORAL
  Filled 2013-10-22 (×3): qty 1

## 2013-10-22 MED ORDER — FENTANYL CITRATE 0.05 MG/ML IJ SOLN
25.0000 ug | Freq: Once | INTRAMUSCULAR | Status: AC
  Administered 2013-10-22: 25 ug via INTRAVENOUS
  Filled 2013-10-22: qty 2

## 2013-10-22 MED ORDER — SODIUM CHLORIDE 0.9 % IV SOLN
1000.0000 mL | INTRAVENOUS | Status: DC
  Administered 2013-10-22 – 2013-10-24 (×5): 1000 mL via INTRAVENOUS

## 2013-10-22 MED ORDER — SODIUM CHLORIDE 0.9 % IV SOLN
3.0000 g | Freq: Once | INTRAVENOUS | Status: AC
  Administered 2013-10-22: 3 g via INTRAVENOUS
  Filled 2013-10-22: qty 3

## 2013-10-22 NOTE — ED Provider Notes (Addendum)
CSN: [id number]     Arrival date & time 10/22/13  1222 History   First MD Initiated Contact with Patient 10/22/13 1233     Chief Complaint  Patient presents with  . Chest Pain     (Consider location/radiation/quality/duration/timing/severity/associated sxs/prior Treatment) HPI Patient states his wife had knee replacement surgery done 2 weeks ago and he has been taking care of her. He reports he has not been eating on a regular basis and actually had not eaten in 3 days until last night when he made chili. He reports she was up most of the night because of her pain. He relates he laid down to sleep and about 10 minutes later woke up with diffuse abdominal pain. He states it was "hard on one side and painful in the other". He also describes pain in his left kidney. He states he got very short of breath. He describes the pain as being pleuritic. He also describes he's had polyuria for the past 4 days and having extreme thirst for the last couple days. He denies feeling weak or dizzy. He is not sure if he took his insulin yesterday however he has not had any today.   PCP none  Past Medical History  Diagnosis Date  . Kidney stones   . Diabetes mellitus   . Cirrhosis   . Adenomatous colon polyp    Past Surgical History  Procedure Laterality Date  . Liver biopsy    . Rotator cuff repair    . Lithotripsy     Family History  Problem Relation Age of Onset  . Colon cancer Neg Hx    History  Substance Use Topics  . Smoking status: Never Smoker   . Smokeless tobacco: Current User    Types: Chew     Comment: Info given on 03-29-11  . 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     Allergies  Codeine; Morphine; Pork-derived products; and Propoxyphene n-acetaminophen  Home Medications   Prior to Admission medications   Medication Sig Start Date End Date Taking? Authorizing Provider  insulin  aspart (NOVOLOG) 100 UNIT/ML injection Inject 30 Units into the skin 3 (three) times daily before meals.   Yes Historical Provider, MD  insulin glargine (LANTUS) 100 UNIT/ML injection Inject 60 Units into the skin 2 (two) times daily.    Yes Historical Provider, MD   BP 124/49  Pulse 62  Temp(Src) 97.6 F (36.4 C) (Oral)  Resp 20  Wt 205 lb (92.987 kg)  SpO2 100%  Vital signs normal except tachypnea  Physical Exam  Nursing note and vitals reviewed. Constitutional: He is oriented to person, place, and time. He appears well-developed and well-nourished.  Non-toxic appearance. He does not appear ill. He appears distressed.  HENT:  Head: Normocephalic and atraumatic.  Right Ear: External ear normal.  Left Ear: External ear normal.  Nose: Nose normal. No mucosal edema or rhinorrhea.  Mouth/Throat: Oropharynx is clear and moist and mucous membranes are normal. No dental abscesses or uvula swelling.  Breath smells fruity, tongue dry  Eyes: Conjunctivae and EOM are normal. Pupils are equal, round, and reactive to light.  Neck: Normal range of motion and full passive range of motion without pain. Neck supple.  Cardiovascular: Normal rate, regular rhythm and normal heart sounds.  Exam reveals no gallop and no friction rub.   No murmur heard. Pulmonary/Chest: Tachypnea noted. He  is in respiratory distress. He has decreased breath sounds. He has no wheezes. He has no rhonchi. He has no rales. He exhibits no tenderness and no crepitus.  Abdominal: Soft. Normal appearance and bowel sounds are normal. He exhibits no distension. There is generalized tenderness. There is no rebound and no guarding.  Musculoskeletal: Normal range of motion. He exhibits no edema and no tenderness.  Moves all extremities well.   Neurological: He is alert and oriented to person, place, and time. He has normal strength. No cranial nerve deficit.  Skin: Skin is warm, dry and intact. No rash noted. No erythema. No pallor.   Psychiatric: He has a normal mood and affect. His speech is normal and behavior is normal. His mood appears not anxious.    ED Course  Procedures (including critical care time)  Medications  0.9 %  sodium chloride infusion (0 mLs Intravenous Stopped 10/22/13 1358)    Followed by  0.9 %  sodium chloride infusion (0 mLs Intravenous Stopped 10/22/13 1358)    Followed by  0.9 %  sodium chloride infusion (not administered)  Ampicillin-Sulbactam (UNASYN) 3 g in sodium chloride 0.9 % 100 mL IVPB (not administered)  HYDROmorphone (DILAUDID) injection 0.5 mg (not administered)  fentaNYL (SUBLIMAZE) injection 25 mcg (25 mcg Intravenous Given 10/22/13 1300)   Nurse reports patient dropped his respiratory rate to 9 he became hypoxic after he got the fentanyl. She placed him on oxygen and had a sitter stay with him to keep him awake. She did not want to give him anything else for pain.  Recheck at 1420 patient is more calm now. He states he's having pain in his epigastric right upper quadrant that radiates into his back. He also describes some difficulty urinating and pain in his flank area. His laboratory results were not back yet. Ultrasound and CT scan were ordered.  Discussed results of radiology studies with patient and need for admission. Pt started on unasyn and will try dilaudid which he states he has had before.   17:35 Dr Doy Mince will speak to surgery about admission  Labs Review Results for orders placed during the hospital encounter of 10/22/13  CBC WITH DIFFERENTIAL      Result Value Ref Range   WBC 3.7 (*) 4.0 - 10.5 K/uL   RBC 4.90  4.22 - 5.81 MIL/uL   Hemoglobin 14.6  13.0 - 17.0 g/dL   HCT 42.0  39.0 - 52.0 %   MCV 85.7  78.0 - 100.0 fL   MCH 29.8  26.0 - 34.0 pg   MCHC 34.8  30.0 - 36.0 g/dL   RDW 14.6  11.5 - 15.5 %   Platelets 48 (*) 150 - 400 K/uL   Neutrophils Relative % 63  43 - 77 %   Neutro Abs 2.3  1.7 - 7.7 K/uL   Lymphocytes Relative 21  12 - 46 %   Lymphs Abs  0.8  0.7 - 4.0 K/uL   Monocytes Relative 13 (*) 3 - 12 %   Monocytes Absolute 0.5  0.1 - 1.0 K/uL   Eosinophils Relative 3  0 - 5 %   Eosinophils Absolute 0.1  0.0 - 0.7 K/uL   Basophils Relative 0  0 - 1 %   Basophils Absolute 0.0  0.0 - 0.1 K/uL  COMPREHENSIVE METABOLIC PANEL      Result Value Ref Range   Sodium 139  137 - 147 mEq/L   Potassium 4.2  3.7 - 5.3 mEq/L   Chloride  103  96 - 112 mEq/L   CO2 23  19 - 32 mEq/L   Glucose, Bld 352 (*) 70 - 99 mg/dL   BUN 8  6 - 23 mg/dL   Creatinine, Ser 0.84  0.50 - 1.35 mg/dL   Calcium 9.1  8.4 - 10.5 mg/dL   Total Protein 7.1  6.0 - 8.3 g/dL   Albumin 3.0 (*) 3.5 - 5.2 g/dL   AST 54 (*) 0 - 37 U/L   ALT 41  0 - 53 U/L   Alkaline Phosphatase 180 (*) 39 - 117 U/L   Total Bilirubin 0.8  0.3 - 1.2 mg/dL   GFR calc non Af Amer >90  >90 mL/min   GFR calc Af Amer >90  >90 mL/min   Anion gap 13  5 - 15  LIPASE, BLOOD      Result Value Ref Range   Lipase 64 (*) 11 - 59 U/L  URINALYSIS, ROUTINE W REFLEX MICROSCOPIC      Result Value Ref Range   Color, Urine YELLOW  YELLOW   APPearance CLEAR  CLEAR   Specific Gravity, Urine 1.030  1.005 - 1.030   pH 5.5  5.0 - 8.0   Glucose, UA >1000 (*) NEGATIVE mg/dL   Hgb urine dipstick NEGATIVE  NEGATIVE   Bilirubin Urine NEGATIVE  NEGATIVE   Ketones, ur NEGATIVE  NEGATIVE mg/dL   Protein, ur NEGATIVE  NEGATIVE mg/dL   Urobilinogen, UA 0.2  0.0 - 1.0 mg/dL   Nitrite NEGATIVE  NEGATIVE   Leukocytes, UA NEGATIVE  NEGATIVE  URINE MICROSCOPIC-ADD ON      Result Value Ref Range   Squamous Epithelial / LPF RARE  RARE   WBC, UA 0-2  <3 WBC/hpf   RBC / HPF 0-2  <3 RBC/hpf   Crystals CA OXALATE CRYSTALS (*) NEGATIVE  CBG MONITORING, ED      Result Value Ref Range   Glucose-Capillary 344 (*) 70 - 99 mg/dL   Comment 1 Documented in Chart     Comment 2 Notify RN    I-STAT CHEM 8, ED      Result Value Ref Range   Sodium 138  137 - 147 mEq/L   Potassium 4.0  3.7 - 5.3 mEq/L   Chloride 102  96 - 112  mEq/L   BUN 7  6 - 23 mg/dL   Creatinine, Ser 0.90  0.50 - 1.35 mg/dL   Glucose, Bld 377 (*) 70 - 99 mg/dL   Calcium, Ion 1.17  1.12 - 1.23 mmol/L   TCO2 21  0 - 100 mmol/L   Hemoglobin 15.3  13.0 - 17.0 g/dL   HCT 45.0  39.0 - 52.0 %    Laboratory interpretation all normal except calcium oxalate crystals which would predispose him to kidney stones, elevated lipase , minor LFT abnormalities, hyperglycemia with glucosuria    Imaging Review Dg Chest Portable 1 View  10/22/2013   CLINICAL DATA:  Shortness of breath.  EXAM: PORTABLE CHEST - 1 VIEW  COMPARISON:  05/04/2013.  FINDINGS: Trachea is midline. Heart size stable. Lungs are somewhat low in volume with mild bibasilar volume loss. No airspace consolidation or pleural fluid.  IMPRESSION: Low lung volumes with mild bibasilar volume loss.   Electronically Signed   By: Lorin Picket M.D.   On: 10/22/2013 13:22   Ct Abdomen Pelvis Wo Contrast  10/22/2013   CLINICAL DATA:  Left-sided abdominal pain. Chest pain. Shortness of breath.  EXAM: CT ABDOMEN AND PELVIS  WITHOUT CONTRAST  TECHNIQUE: Multidetector CT imaging of the abdomen and pelvis was performed following the standard protocol without IV contrast.  COMPARISON:  06/26/2013.  FINDINGS: Musculoskeletal: No aggressive osseous lesions. Bilateral hip osteoarthritis.  Lung Bases: Respiratory motion at the lung bases. Dependent atelectasis. Scarring in the RIGHT middle lobe anteriorly.  Liver: Hepatic cirrhosis. No focal mass lesion identified on noncontrast imaging. No intrahepatic biliary ductal dilation. Dome of the liver suboptimally visualized due to respiratory motion artifact. Unenhanced CT was performed per clinician order. Lack of IV contrast limits sensitivity and specificity, especially for evaluation of abdominal/pelvic solid viscera.  Spleen:  Splenomegaly with 15.5 cm splenic span.  Gallbladder: Distended with calcified stones layering dependently. No inflammatory changes of the  gallbladder by noncontrast CT.  Common bile duct: No calcified stone in the common bile duct. No common bile duct dilation.  Pancreas:  Normal.  Adrenal glands:  Normal bilaterally.  Kidneys: Nonobstructing LEFT renal collecting system calculus in the inferior pole measuring 4 mm. LEFT ureter is normal. The RIGHT ureter is normal. No RIGHT renal calculi are identified.  Stomach: Distal esophageal thickening is nonspecific but most commonly associated with gastroesophageal reflux. No inflammatory changes of stomach grossly.  Small bowel: Duodenum appears normal. No small bowel obstruction. No mesenteric adenopathy.  Colon:   Normal appendix.  Large stool burden.  Pelvic Genitourinary: Prostate calcifications. Urinary bladder appears within normal limits.  Vasculature: Atherosclerosis. Findings compatible with portal venous hypertension with perisplenic collaterals in the LEFT upper quadrant. There also appears to be recanalization of the periumbilical vein although this appears small.  Body Wall: Tiny fat containing periumbilical hernia.  IMPRESSION: 1. Hepatic cirrhosis. 2. Cholelithiasis. 3. 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 Portal venous collaterals in the LEFT upper quadrant. 4. Nonobstructing LEFT renal calculi.   Electronically Signed   By: Dereck Ligas M.D.   On: 10/22/2013 17:19   US Abdomen Limited  10/22/2013   CLINICAL DATA:  Epigastric abdominal pain radiating into the flanks.  EXAM: US ABDOMEN LIMITED - RIGHT UPPER QUADRANT  COMPARISON:  CT 10/22/2013 and multiple prior CT exams. Ultrasound 03/21/2013.  FINDINGS: Gallbladder:  Gallbladder wall measures 7 mm. In the setting of cirrhosis, this is a nonspecific finding. Sonographic Percell Miller sign is reported.  Common bile duct:  Diameter: 7 mm, within normal limits for age. No common duct stone is identified. The common bile duct size has increased compared 03/21/2013 ultrasound when it measured 4 mm.  Liver:  Hepatic  cirrhosis.  No focal mass lesion is identified.  IMPRESSION: 1. Hepatic cirrhosis. 2. Gallbladder wall thickening is nonspecific in the setting of cirrhosis however in the presence of sonographic Murphy sign and gallstones on CT, these findings are suspicious for acute cholecystitis. No definite inflammatory changes were identified on CT today however CT is less sensitive than ultrasound for detection of acute cholecystitis. If the clinical scenario is equivocal, nuclear medicine HIDA scan could be considered to assess for cholecystitis. 3. Increasing size of the common bile duct compared to prior exam earlier this year. No common duct stone is identified.   Electronically Signed   By: Dereck Ligas M.D.   On: 10/22/2013 17:21      EKG Interpretation   Date/Time:  Monday October 22 2013 12:28:04 EDT Ventricular Rate:  72 PR Interval:  186 QRS Duration: 80 QT Interval:  380 QTC Calculation: 416 R Axis:   50 Text Interpretation:  Normal sinus rhythm Normal ECG No significant change  since last  tracing 10 Jan 2009 Confirmed by [facility]  MD-I, Biance Moncrief ([id number]) on  10/22/2013 12:32:27 PM      MDM   Final diagnoses:  Right upper quadrant pain  Cholecystitis, acute  Acute biliary pancreatitis    Plan admission   Rolland Porter, MD, Alanson Aly, MD 10/22/13 [place] Malikye Reppond, MD 10/22/13 1740

## 2013-10-22 NOTE — ED Notes (Signed)
Cp and sob started this am left sided states was up all night w/ his wife last night who had knee replacement recently

## 2013-10-22 NOTE — ED Notes (Signed)
Pt refused a set of vitals. Pt also took his gown off and put his home clothes on.

## 2013-10-22 NOTE — ED Notes (Signed)
Pt states left sided abdominal pain, with chest pain and shortness of breathe. Pt restless, figiting and rates pain 10.10.

## 2013-10-22 NOTE — ED Provider Notes (Signed)
Care assumed from Dr. Tomi Bamberger.  I have consulted Dr. Barry Dienes.  She requested admission by Internal Medicine.  Discussed with IM teaching service for admission.  Clinical Impression: 1. Right upper quadrant pain   2. Cholecystitis, acute     Houston Siren III, MD 10/22/13 [phone]

## 2013-10-22 NOTE — ED Notes (Signed)
Attempted report 

## 2013-10-22 NOTE — ED Notes (Signed)
When pt fell asleep his respirations dropped to 6. Pt woken up. MD aware.

## 2013-10-22 NOTE — Consult Note (Signed)
Reason for Consult:Cholelithiasis Referring Physician: Doy Mince, MD (emergency medicine)  Bradley Green is an 58 y.o. male.  HPI:  Pt is a 58 yo M who presents to the ED with severe pain starting last last night/early this morning after eating some chili.  He has been very stressed since his wife had a knee replacement several weeks ago as he has been trying to work, take care of her, and deal with being fatigued.  He has had some on and off abdominal discomfort in the last few weeks that has been in both upper quadrants. He describes that as crampy and less severe that what is going on now.  This pain is so bad that he has been unable to sleep.  He also thinks his kidney stones are acting up.  He has had some nausea and decreased appetite as well these past few weeks.    Past Medical History  Diagnosis Date  . Kidney stones   . Diabetes mellitus   . Cirrhosis   . Adenomatous colon polyp     Past Surgical History  Procedure Laterality Date  . Liver biopsy    . Rotator cuff repair    . Lithotripsy      Family History  Problem Relation Age of Onset  . Colon cancer Neg Hx     Social History:  reports that he has never smoked. His smokeless tobacco use includes Chew. He reports that he does not drink alcohol or use illicit drugs.  Allergies:  Allergies  Allergen Reactions  . Codeine     REACTION: difficulty sleeping  . Morphine     REACTION: vomiting, diarrhea, hives  . Pork-Derived Metallurgist  . Propoxyphene N-Acetaminophen     REACTION: hives, diarrhea    Medications:  Prior to Admission:  Prescriptions prior to admission  Medication Sig Dispense Refill  . insulin aspart (NOVOLOG) 100 UNIT/ML injection Inject 30 Units into the skin 3 (three) times daily before meals.      . insulin glargine (LANTUS) 100 UNIT/ML injection Inject 60 Units into the skin 2 (two) times daily.         Results for orders placed during the hospital encounter of 10/22/13 (from the past 48  hour(s))  CBG MONITORING, ED     Status: Abnormal   Collection Time    10/22/13 12:36 PM      Result Value Ref Range   Glucose-Capillary 344 (*) 70 - 99 mg/dL   Comment 1 Documented in Chart     Comment 2 Notify RN    CBC WITH DIFFERENTIAL     Status: Abnormal   Collection Time    10/22/13 12:45 PM      Result Value Ref Range   WBC 3.7 (*) 4.0 - 10.5 K/uL   RBC 4.90  4.22 - 5.81 MIL/uL   Hemoglobin 14.6  13.0 - 17.0 g/dL   HCT 42.0  39.0 - 52.0 %   MCV 85.7  78.0 - 100.0 fL   MCH 29.8  26.0 - 34.0 pg   MCHC 34.8  30.0 - 36.0 g/dL   RDW 14.6  11.5 - 15.5 %   Platelets 48 (*) 150 - 400 K/uL   Comment: PLATELET COUNT CONFIRMED BY SMEAR   Neutrophils Relative % 63  43 - 77 %   Neutro Abs 2.3  1.7 - 7.7 K/uL   Lymphocytes Relative 21  12 - 46 %   Lymphs Abs 0.8  0.7 - 4.0 K/uL   Monocytes  Relative 13 (*) 3 - 12 %   Monocytes Absolute 0.5  0.1 - 1.0 K/uL   Eosinophils Relative 3  0 - 5 %   Eosinophils Absolute 0.1  0.0 - 0.7 K/uL   Basophils Relative 0  0 - 1 %   Basophils Absolute 0.0  0.0 - 0.1 K/uL  COMPREHENSIVE METABOLIC PANEL     Status: Abnormal   Collection Time    10/22/13 12:45 PM      Result Value Ref Range   Sodium 139  137 - 147 mEq/L   Potassium 4.2  3.7 - 5.3 mEq/L   Chloride 103  96 - 112 mEq/L   CO2 23  19 - 32 mEq/L   Glucose, Bld 352 (*) 70 - 99 mg/dL   BUN 8  6 - 23 mg/dL   Creatinine, Ser 0.84  0.50 - 1.35 mg/dL   Calcium 9.1  8.4 - 10.5 mg/dL   Total Protein 7.1  6.0 - 8.3 g/dL   Albumin 3.0 (*) 3.5 - 5.2 g/dL   AST 54 (*) 0 - 37 U/L   ALT 41  0 - 53 U/L   Alkaline Phosphatase 180 (*) 39 - 117 U/L   Total Bilirubin 0.8  0.3 - 1.2 mg/dL   GFR calc non Af Amer >90  >90 mL/min   GFR calc Af Amer >90  >90 mL/min   Comment: (NOTE)     The eGFR has been calculated using the CKD EPI equation.     This calculation has not been validated in all clinical situations.     eGFR's persistently <90 mL/min signify possible Chronic Kidney     Disease.   Anion  gap 13  5 - 15  LIPASE, BLOOD     Status: Abnormal   Collection Time    10/22/13 12:45 PM      Result Value Ref Range   Lipase 64 (*) 11 - 59 U/L  I-STAT CHEM 8, ED     Status: Abnormal   Collection Time    10/22/13  1:05 PM      Result Value Ref Range   Sodium 138  137 - 147 mEq/L   Potassium 4.0  3.7 - 5.3 mEq/L   Chloride 102  96 - 112 mEq/L   BUN 7  6 - 23 mg/dL   Creatinine, Ser 0.90  0.50 - 1.35 mg/dL   Glucose, Bld 377 (*) 70 - 99 mg/dL   Calcium, Ion 1.17  1.12 - 1.23 mmol/L   TCO2 21  0 - 100 mmol/L   Hemoglobin 15.3  13.0 - 17.0 g/dL   HCT 45.0  39.0 - 52.0 %  URINALYSIS, ROUTINE W REFLEX MICROSCOPIC     Status: Abnormal   Collection Time    10/22/13  2:00 PM      Result Value Ref Range   Color, Urine YELLOW  YELLOW   APPearance CLEAR  CLEAR   Specific Gravity, Urine 1.030  1.005 - 1.030   pH 5.5  5.0 - 8.0   Glucose, UA >1000 (*) NEGATIVE mg/dL   Hgb urine dipstick NEGATIVE  NEGATIVE   Bilirubin Urine NEGATIVE  NEGATIVE   Ketones, ur NEGATIVE  NEGATIVE mg/dL   Protein, ur NEGATIVE  NEGATIVE mg/dL   Urobilinogen, UA 0.2  0.0 - 1.0 mg/dL   Nitrite NEGATIVE  NEGATIVE   Leukocytes, UA NEGATIVE  NEGATIVE  URINE MICROSCOPIC-ADD ON     Status: Abnormal   Collection Time  10/22/13  2:00 PM      Result Value Ref Range   Squamous Epithelial / LPF RARE  RARE   WBC, UA 0-2  <3 WBC/hpf   RBC / HPF 0-2  <3 RBC/hpf   Crystals CA OXALATE CRYSTALS (*) NEGATIVE  PROTIME-INR     Status: None   Collection Time    10/22/13  8:29 PM      Result Value Ref Range   Prothrombin Time 14.7  11.6 - 15.2 seconds   INR 1.15  0.00 - 1.49    Ct Abdomen Pelvis Wo Contrast  10/22/2013   CLINICAL DATA:  Left-sided abdominal pain. Chest pain. Shortness of breath.  EXAM: CT ABDOMEN AND PELVIS WITHOUT CONTRAST  TECHNIQUE: Multidetector CT imaging of the abdomen and pelvis was performed following the standard protocol without IV contrast.  COMPARISON:  06/26/2013.  FINDINGS:  Musculoskeletal: No aggressive osseous lesions. Bilateral hip osteoarthritis.  Lung Bases: Respiratory motion at the lung bases. Dependent atelectasis. Scarring in the RIGHT middle lobe anteriorly.  Liver: Hepatic cirrhosis. No focal mass lesion identified on noncontrast imaging. No intrahepatic biliary ductal dilation. Dome of the liver suboptimally visualized due to respiratory motion artifact. Unenhanced CT was performed per clinician order. Lack of IV contrast limits sensitivity and specificity, especially for evaluation of abdominal/pelvic solid viscera.  Spleen:  Splenomegaly with 15.5 cm splenic span.  Gallbladder: Distended with calcified stones layering dependently. No inflammatory changes of the gallbladder by noncontrast CT.  Common bile duct: No calcified stone in the common bile duct. No common bile duct dilation.  Pancreas:  Normal.  Adrenal glands:  Normal bilaterally.  Kidneys: Nonobstructing LEFT renal collecting system calculus in the inferior pole measuring 4 mm. LEFT ureter is normal. The RIGHT ureter is normal. No RIGHT renal calculi are identified.  Stomach: Distal esophageal thickening is nonspecific but most commonly associated with gastroesophageal reflux. No inflammatory changes of stomach grossly.  Small bowel: Duodenum appears normal. No small bowel obstruction. No mesenteric adenopathy.  Colon:   Normal appendix.  Large stool burden.  Pelvic Genitourinary: Prostate calcifications. Urinary bladder appears within normal limits.  Vasculature: Atherosclerosis. Findings compatible with portal venous hypertension with perisplenic collaterals in the LEFT upper quadrant. There also appears to be recanalization of the periumbilical vein although this appears small.  Body Wall: Tiny fat containing periumbilical hernia.  IMPRESSION: 1. Hepatic cirrhosis. 2. Cholelithiasis. 3. 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 Portal venous collaterals in the LEFT upper quadrant. 4.  Nonobstructing LEFT renal calculi.   Electronically Signed   By: Dereck Ligas M.D.   On: 10/22/2013 17:19   US Abdomen Limited  10/22/2013   CLINICAL DATA:  Epigastric abdominal pain radiating into the flanks.  EXAM: US ABDOMEN LIMITED - RIGHT UPPER QUADRANT  COMPARISON:  CT 10/22/2013 and multiple prior CT exams. Ultrasound 03/21/2013.  FINDINGS: Gallbladder:  Gallbladder wall measures 7 mm. In the setting of cirrhosis, this is a nonspecific finding. Sonographic Percell Miller sign is reported.  Common bile duct:  Diameter: 7 mm, within normal limits for age. No common duct stone is identified. The common bile duct size has increased compared 03/21/2013 ultrasound when it measured 4 mm.  Liver:  Hepatic cirrhosis.  No focal mass lesion is identified.  IMPRESSION: 1. Hepatic cirrhosis. 2. Gallbladder wall thickening is nonspecific in the setting of cirrhosis however in the presence of sonographic Murphy sign and gallstones on CT, these findings are suspicious for acute cholecystitis. No definite inflammatory changes were identified on  CT today however CT is less sensitive than ultrasound for detection of acute cholecystitis. If the clinical scenario is equivocal, nuclear medicine HIDA scan could be considered to assess for cholecystitis. 3. Increasing size of the common bile duct compared to prior exam earlier this year. No common duct stone is identified.   Electronically Signed   By: Dereck Ligas M.D.   On: 10/22/2013 17:21   Dg Chest Portable 1 View  10/22/2013   CLINICAL DATA:  Shortness of breath.  EXAM: PORTABLE CHEST - 1 VIEW  COMPARISON:  05/04/2013.  FINDINGS: Trachea is midline. Heart size stable. Lungs are somewhat low in volume with mild bibasilar volume loss. No airspace consolidation or pleural fluid.  IMPRESSION: Low lung volumes with mild bibasilar volume loss.   Electronically Signed   By: Lorin Picket M.D.   On: 10/22/2013 13:22    Review of Systems  Constitutional: Positive for  malaise/fatigue.  HENT: Negative.   Eyes: Negative.   Respiratory: Negative.   Cardiovascular: Negative.   Gastrointestinal: Positive for nausea and abdominal pain. Negative for blood in stool and melena.       Decreased appetite  Genitourinary: Positive for frequency.  Musculoskeletal: Positive for back pain.  Skin: Negative.   Neurological: Positive for weakness.  Psychiatric/Behavioral: Negative.    Blood pressure 138/71, pulse 58, temperature 97.7 F (36.5 C), temperature source Oral, resp. rate 16, height $RemoveBe'5\' 7"'[id number]$  (1.702 m), weight 209 lb 14.4 oz (95.21 kg), SpO2 100.00%. Physical Exam  Constitutional: He is oriented to person, place, and time. He appears well-developed and well-nourished. No distress.  HENT:  Head: Normocephalic and atraumatic.  Mouth/Throat: Oropharynx is clear and moist.  Eyes: Conjunctivae are normal. Pupils are equal, round, and reactive to light. No scleral icterus.  Neck: Normal range of motion. Neck supple.  Cardiovascular: Normal rate and intact distal pulses.   Respiratory: Effort normal. No respiratory distress.  GI: Soft. He exhibits distension (mildly distended). There is tenderness (RUQ tenderness). There is guarding (voluntary guarding RUQ). There is no rebound.  Neurological: He is alert and oriented to person, place, and time.  Skin: Skin is warm and dry. No rash noted. He is not diaphoretic. No erythema. No pallor.  Psychiatric: He has a normal mood and affect. Judgment and thought content normal.  Unusual affect    Assessment/Plan: Acute cholecystitis Would get platelets ready and do lap chole.  Pt had acute RUQ pain that occurred post prandially in setting of gallstones and positive murphy's sign.   IV antibiotics.  NPO after 1 am.   Pain control Antiemetics.  Cirrhosis and thrombocytopenia-  The patient will need to have platelets on call to OR.  DM - the blood sugar will need to be better controlled prior to surgery.  I anticipate that  this would be below 250 in AM.    Renal calculi - non obstructing stone only on left.    I will discuss this patient with Dr. Georgette Dover in AM.  Stark Klein 10/22/2013, 10:21 PM

## 2013-10-23 ENCOUNTER — Encounter (HOSPITAL_COMMUNITY): Payer: Self-pay | Admitting: Certified Registered"

## 2013-10-23 ENCOUNTER — Inpatient Hospital Stay (HOSPITAL_COMMUNITY): Payer: BC Managed Care – PPO

## 2013-10-23 ENCOUNTER — Inpatient Hospital Stay (HOSPITAL_COMMUNITY): Payer: Self-pay | Admitting: Certified Registered"

## 2013-10-23 ENCOUNTER — Encounter (HOSPITAL_COMMUNITY)
Admission: EM | Disposition: A | Discharge status: Home / Self Care | Payer: Self-pay | Source: Home / Self Care | Attending: Internal Medicine

## 2013-10-23 DIAGNOSIS — D696 Thrombocytopenia, unspecified: Secondary | ICD-10-CM

## 2013-10-23 DIAGNOSIS — K810 Acute cholecystitis: Secondary | ICD-10-CM

## 2013-10-23 DIAGNOSIS — K8010 Calculus of gallbladder with chronic cholecystitis without obstruction: Secondary | ICD-10-CM

## 2013-10-23 DIAGNOSIS — N400 Enlarged prostate without lower urinary tract symptoms: Secondary | ICD-10-CM

## 2013-10-23 HISTORY — PX: CHOLECYSTECTOMY: SHX55

## 2013-10-23 LAB — GLUCOSE, CAPILLARY
GLUCOSE-CAPILLARY: 177 mg/dL — AB (ref 70–99)
GLUCOSE-CAPILLARY: 166 mg/dL — AB (ref 70–99)
GLUCOSE-CAPILLARY: 198 mg/dL — AB (ref 70–99)
GLUCOSE-CAPILLARY: 193 mg/dL — AB (ref 70–99)
Glucose-Capillary: 374 mg/dL — ABNORMAL HIGH (ref 70–99)
Glucose-Capillary: 413 mg/dL — ABNORMAL HIGH (ref 70–99)

## 2013-10-23 LAB — HEMOGLOBIN A1C
Hgb A1c MFr Bld: 11.1 % — ABNORMAL HIGH (ref <5.7)
Mean Plasma Glucose: 272 mg/dL — ABNORMAL HIGH (ref <117)

## 2013-10-23 LAB — COMPREHENSIVE METABOLIC PANEL
ALT: 40 U/L (ref 0–53)
AST: 55 U/L — ABNORMAL HIGH (ref 0–37)
Albumin: 2.9 g/dL — ABNORMAL LOW (ref 3.5–5.2)
Alkaline Phosphatase: 171 U/L — ABNORMAL HIGH (ref 39–117)
Anion gap: 9 (ref 5–15)
BUN: 8 mg/dL (ref 6–23)
CHLORIDE: 106 meq/L (ref 96–112)
CO2: 26 mEq/L (ref 19–32)
Calcium: 8.5 mg/dL (ref 8.4–10.5)
Creatinine, Ser: 0.81 mg/dL (ref 0.50–1.35)
GFR calc Af Amer: >90 mL/min (ref >90)
GFR calc non Af Amer: >90 mL/min (ref >90)
GLUCOSE: 155 mg/dL — AB (ref 70–99)
POTASSIUM: 4 meq/L (ref 3.7–5.3)
Sodium: 141 mEq/L (ref 137–147)
TOTAL PROTEIN: 6.8 g/dL (ref 6.0–8.3)
Total Bilirubin: 1.4 mg/dL — ABNORMAL HIGH (ref 0.3–1.2)

## 2013-10-23 LAB — SURGICAL PCR SCREEN
MRSA, PCR: NEGATIVE
Staphylococcus aureus: NEGATIVE

## 2013-10-23 LAB — TYPE AND SCREEN
ABO/RH(D): O NEG
ANTIBODY SCREEN: NEGATIVE

## 2013-10-23 LAB — ABO/RH: ABO/RH(D): O NEG

## 2013-10-23 LAB — CBC
HEMATOCRIT: 39.6 % (ref 39.0–52.0)
HEMOGLOBIN: 13.7 g/dL (ref 13.0–17.0)
MCH: 29.4 pg (ref 26.0–34.0)
MCHC: 34.6 g/dL (ref 30.0–36.0)
MCV: 85 fL (ref 78.0–100.0)
Platelets: 44 10*3/uL — ABNORMAL LOW (ref 150–400)
RBC: 4.66 MIL/uL (ref 4.22–5.81)
RDW: 14.5 % (ref 11.5–15.5)
WBC: 3.7 10*3/uL — ABNORMAL LOW (ref 4.0–10.5)

## 2013-10-23 LAB — HIV ANTIBODY (ROUTINE TESTING W REFLEX): HIV: NONREACTIVE

## 2013-10-23 SURGERY — LAPAROSCOPIC CHOLECYSTECTOMY WITH INTRAOPERATIVE CHOLANGIOGRAM
Anesthesia: General | Site: Abdomen

## 2013-10-23 MED ORDER — 0.9 % SODIUM CHLORIDE (POUR BTL) OPTIME
TOPICAL | Status: DC | PRN
  Administered 2013-10-23: 1000 mL

## 2013-10-23 MED ORDER — INSULIN ASPART 100 UNIT/ML ~~LOC~~ SOLN
0.0000 [IU] | SUBCUTANEOUS | Status: DC
Start: 2013-10-23 — End: 2013-10-24
  Administered 2013-10-23: 3 [IU] via SUBCUTANEOUS
  Administered 2013-10-23: 15 [IU] via SUBCUTANEOUS
  Administered 2013-10-24: 5 [IU] via SUBCUTANEOUS
  Administered 2013-10-24: 8 [IU] via SUBCUTANEOUS
  Administered 2013-10-24: 15 [IU] via SUBCUTANEOUS
  Administered 2013-10-24: 5 [IU] via SUBCUTANEOUS

## 2013-10-23 MED ORDER — SODIUM CHLORIDE 0.9 % IR SOLN
Status: DC | PRN
  Administered 2013-10-23 (×2): 1000 mL

## 2013-10-23 MED ORDER — DIPHENHYDRAMINE HCL 25 MG PO CAPS
25.0000 mg | ORAL_CAPSULE | Freq: Once | ORAL | Status: AC
  Administered 2013-10-23: 25 mg via ORAL
  Filled 2013-10-23: qty 1

## 2013-10-23 MED ORDER — MIDAZOLAM HCL 2 MG/2ML IJ SOLN
INTRAMUSCULAR | Status: AC
  Filled 2013-10-23: qty 2

## 2013-10-23 MED ORDER — DEXMEDETOMIDINE HCL 200 MCG/2ML IV SOLN
INTRAVENOUS | Status: DC | PRN
  Administered 2013-10-23: 10 ug via INTRAVENOUS

## 2013-10-23 MED ORDER — PNEUMOCOCCAL VAC POLYVALENT 25 MCG/0.5ML IJ INJ
0.5000 mL | INJECTION | INTRAMUSCULAR | Status: AC
Start: 2013-10-23 — End: 2013-10-24
  Administered 2013-10-24: 0.5 mL via INTRAMUSCULAR
  Filled 2013-10-23 (×2): qty 0.5

## 2013-10-23 MED ORDER — ROCURONIUM BROMIDE 100 MG/10ML IV SOLN
INTRAVENOUS | Status: DC | PRN
  Administered 2013-10-23: 25 mg via INTRAVENOUS

## 2013-10-23 MED ORDER — FENTANYL CITRATE 0.05 MG/ML IJ SOLN
INTRAMUSCULAR | Status: DC | PRN
  Administered 2013-10-23 (×4): 50 ug via INTRAVENOUS

## 2013-10-23 MED ORDER — FENTANYL CITRATE 0.05 MG/ML IJ SOLN
25.0000 ug | INTRAMUSCULAR | Status: DC | PRN
  Administered 2013-10-23: 50 ug via INTRAVENOUS
  Administered 2013-10-23: 25 ug via INTRAVENOUS

## 2013-10-23 MED ORDER — GLYCOPYRROLATE 0.2 MG/ML IJ SOLN
INTRAMUSCULAR | Status: AC
  Filled 2013-10-23: qty 2

## 2013-10-23 MED ORDER — INSULIN GLARGINE 100 UNIT/ML ~~LOC~~ SOLN
15.0000 [IU] | Freq: Every day | SUBCUTANEOUS | Status: DC
  Administered 2013-10-23: 15 [IU] via SUBCUTANEOUS
  Filled 2013-10-23 (×2): qty 0.15

## 2013-10-23 MED ORDER — HYDROMORPHONE HCL PF 1 MG/ML IJ SOLN
1.0000 mg | INTRAMUSCULAR | Status: DC | PRN
  Administered 2013-10-23 – 2013-10-24 (×5): 1 mg via INTRAVENOUS
  Filled 2013-10-23 (×5): qty 1

## 2013-10-23 MED ORDER — HEMOSTATIC AGENTS (NO CHARGE) OPTIME
TOPICAL | Status: DC | PRN
  Administered 2013-10-23: 1 via TOPICAL

## 2013-10-23 MED ORDER — OXYCODONE-ACETAMINOPHEN 5-325 MG PO TABS
1.0000 | ORAL_TABLET | ORAL | Status: DC | PRN
  Administered 2013-10-23: 2 via ORAL
  Filled 2013-10-23: qty 2

## 2013-10-23 MED ORDER — NEOSTIGMINE METHYLSULFATE 10 MG/10ML IV SOLN
INTRAVENOUS | Status: DC | PRN
  Administered 2013-10-23: 3 mg via INTRAVENOUS

## 2013-10-23 MED ORDER — FENTANYL CITRATE 0.05 MG/ML IJ SOLN
INTRAMUSCULAR | Status: AC
  Filled 2013-10-23: qty 5

## 2013-10-23 MED ORDER — DIPHENHYDRAMINE HCL 50 MG/ML IJ SOLN
INTRAMUSCULAR | Status: DC | PRN
  Administered 2013-10-23: 12.5 mg via INTRAVENOUS

## 2013-10-23 MED ORDER — SUCCINYLCHOLINE CHLORIDE 20 MG/ML IJ SOLN
INTRAMUSCULAR | Status: DC | PRN
  Administered 2013-10-23: 120 mg via INTRAVENOUS

## 2013-10-23 MED ORDER — GLYCOPYRROLATE 0.2 MG/ML IJ SOLN
INTRAMUSCULAR | Status: DC | PRN
  Administered 2013-10-23: 0.4 mg via INTRAVENOUS

## 2013-10-23 MED ORDER — DEXAMETHASONE SODIUM PHOSPHATE 4 MG/ML IJ SOLN
INTRAMUSCULAR | Status: DC | PRN
  Administered 2013-10-23: 4 mg via INTRAVENOUS

## 2013-10-23 MED ORDER — SODIUM CHLORIDE 0.9 % IV SOLN
INTRAVENOUS | Status: DC | PRN
  Administered 2013-10-23: 11:00:00

## 2013-10-23 MED ORDER — PROPOFOL 10 MG/ML IV BOLUS
INTRAVENOUS | Status: DC | PRN
  Administered 2013-10-23: 130 mg via INTRAVENOUS

## 2013-10-23 MED ORDER — MIDAZOLAM HCL 5 MG/5ML IJ SOLN
INTRAMUSCULAR | Status: DC | PRN
  Administered 2013-10-23: 2 mg via INTRAVENOUS

## 2013-10-23 MED ORDER — BUPIVACAINE-EPINEPHRINE (PF) 0.25% -1:200000 IJ SOLN
INTRAMUSCULAR | Status: AC
  Filled 2013-10-23: qty 30

## 2013-10-23 MED ORDER — ROCURONIUM BROMIDE 50 MG/5ML IV SOLN
INTRAVENOUS | Status: AC
  Filled 2013-10-23: qty 1

## 2013-10-23 MED ORDER — PROMETHAZINE HCL 25 MG/ML IJ SOLN: 6.2500 mg | INTRAMUSCULAR | Status: DC | PRN

## 2013-10-23 MED ORDER — LIDOCAINE HCL (CARDIAC) 20 MG/ML IV SOLN
INTRAVENOUS | Status: DC | PRN
  Administered 2013-10-23: 50 mg via INTRAVENOUS

## 2013-10-23 MED ORDER — ONDANSETRON HCL 4 MG/2ML IJ SOLN: 4.0000 mg | Freq: Four times a day (QID) | INTRAMUSCULAR | Status: DC | PRN

## 2013-10-23 MED ORDER — BUPIVACAINE-EPINEPHRINE 0.25% -1:200000 IJ SOLN
INTRAMUSCULAR | Status: DC | PRN
  Administered 2013-10-23: 13 mL

## 2013-10-23 MED ORDER — LACTATED RINGERS IV SOLN: INTRAVENOUS | Status: DC

## 2013-10-23 MED ORDER — SODIUM CHLORIDE 0.9 % IV SOLN: Freq: Once | INTRAVENOUS | Status: DC

## 2013-10-23 MED ORDER — LACTATED RINGERS IV SOLN
INTRAVENOUS | Status: DC | PRN
  Administered 2013-10-23: 11:00:00 via INTRAVENOUS

## 2013-10-23 MED ORDER — FENTANYL CITRATE 0.05 MG/ML IJ SOLN
INTRAMUSCULAR | Status: AC
  Filled 2013-10-23: qty 2

## 2013-10-23 MED ORDER — BUPIVACAINE HCL (PF) 0.25 % IJ SOLN
INTRAMUSCULAR | Status: AC
  Filled 2013-10-23: qty 30

## 2013-10-23 MED ORDER — PROPOFOL 10 MG/ML IV BOLUS
INTRAVENOUS | Status: AC
  Filled 2013-10-23: qty 20

## 2013-10-23 MED ORDER — MEPERIDINE HCL 25 MG/ML IJ SOLN: 6.2500 mg | INTRAMUSCULAR | Status: DC | PRN

## 2013-10-23 MED ORDER — ONDANSETRON HCL 4 MG/2ML IJ SOLN
INTRAMUSCULAR | Status: DC | PRN
  Administered 2013-10-23: 4 mg via INTRAVENOUS

## 2013-10-23 MED ORDER — ONDANSETRON HCL 4 MG PO TABS: 4.0000 mg | ORAL_TABLET | Freq: Four times a day (QID) | ORAL | Status: DC | PRN

## 2013-10-23 SURGICAL SUPPLY — 45 items
APPLIER CLIP ROT 10 11.4 M/L (STAPLE) ×3
BENZOIN TINCTURE PRP APPL 2/3 (GAUZE/BANDAGES/DRESSINGS) ×3 IMPLANT
BLADE SURG ROTATE 9660 (MISCELLANEOUS) ×3 IMPLANT
CANISTER SUCTION 2500CC (MISCELLANEOUS) ×3 IMPLANT
CHLORAPREP W/TINT 26ML (MISCELLANEOUS) ×3 IMPLANT
CLIP APPLIE ROT 10 11.4 M/L (STAPLE) ×1 IMPLANT
CLOSURE WOUND 1/2 X4 (GAUZE/BANDAGES/DRESSINGS) ×1
COVER MAYO STAND STRL (DRAPES) ×3 IMPLANT
COVER SURGICAL LIGHT HANDLE (MISCELLANEOUS) ×3 IMPLANT
DRAPE C-ARM 42X72 X-RAY (DRAPES) ×3 IMPLANT
DRAPE UTILITY 15X26 W/TAPE STR (DRAPE) ×6 IMPLANT
DRSG TEGADERM 2-3/8X2-3/4 SM (GAUZE/BANDAGES/DRESSINGS) ×9 IMPLANT
DRSG TEGADERM 4X4.75 (GAUZE/BANDAGES/DRESSINGS) ×3 IMPLANT
ELECT REM PT RETURN 9FT ADLT (ELECTROSURGICAL) ×3
ELECTRODE REM PT RTRN 9FT ADLT (ELECTROSURGICAL) ×1 IMPLANT
FILTER SMOKE EVAC LAPAROSHD (FILTER) ×3 IMPLANT
GAUZE SPONGE 2X2 8PLY STRL LF (GAUZE/BANDAGES/DRESSINGS) ×1 IMPLANT
GLOVE BIO SURGEON STRL SZ7 (GLOVE) ×3 IMPLANT
GLOVE BIOGEL PI IND STRL 7.0 (GLOVE) ×2 IMPLANT
GLOVE BIOGEL PI IND STRL 7.5 (GLOVE) ×1 IMPLANT
GLOVE BIOGEL PI INDICATOR 7.0 (GLOVE) ×4
GLOVE BIOGEL PI INDICATOR 7.5 (GLOVE) ×2
GLOVE SURG SS PI 7.0 STRL IVOR (GLOVE) ×6 IMPLANT
GOWN STRL REUS W/ TWL LRG LVL3 (GOWN DISPOSABLE) ×4 IMPLANT
GOWN STRL REUS W/TWL LRG LVL3 (GOWN DISPOSABLE) ×8
HEMOSTAT SNOW SURGICEL 2X4 (HEMOSTASIS) ×3 IMPLANT
KIT BASIN OR (CUSTOM PROCEDURE TRAY) ×3 IMPLANT
KIT ROOM TURNOVER OR (KITS) ×3 IMPLANT
NS IRRIG 1000ML POUR BTL (IV SOLUTION) ×3 IMPLANT
PAD ARMBOARD 7.5X6 YLW CONV (MISCELLANEOUS) ×3 IMPLANT
POUCH SPECIMEN RETRIEVAL 10MM (ENDOMECHANICALS) ×3 IMPLANT
SCISSORS LAP 5X35 DISP (ENDOMECHANICALS) ×3 IMPLANT
SET CHOLANGIOGRAPH 5 50 .035 (SET/KITS/TRAYS/PACK) ×3 IMPLANT
SET IRRIG TUBING LAPAROSCOPIC (IRRIGATION / IRRIGATOR) ×3 IMPLANT
SLEEVE ENDOPATH XCEL 5M (ENDOMECHANICALS) ×3 IMPLANT
SPECIMEN JAR SMALL (MISCELLANEOUS) ×3 IMPLANT
SPONGE GAUZE 2X2 STER 10/PKG (GAUZE/BANDAGES/DRESSINGS) ×2
STRIP CLOSURE SKIN 1/2X4 (GAUZE/BANDAGES/DRESSINGS) ×2 IMPLANT
SUT MNCRL AB 4-0 PS2 18 (SUTURE) ×3 IMPLANT
TOWEL OR 17X24 6PK STRL BLUE (TOWEL DISPOSABLE) IMPLANT
TOWEL OR 17X26 10 PK STRL BLUE (TOWEL DISPOSABLE) ×3 IMPLANT
TRAY LAPAROSCOPIC (CUSTOM PROCEDURE TRAY) ×3 IMPLANT
TROCAR XCEL BLUNT TIP 100MML (ENDOMECHANICALS) ×3 IMPLANT
TROCAR XCEL NON-BLD 11X100MML (ENDOMECHANICALS) ×3 IMPLANT
TROCAR XCEL NON-BLD 5MMX100MML (ENDOMECHANICALS) ×3 IMPLANT

## 2013-10-23 NOTE — Progress Notes (Signed)
Day of Surgery  Subjective: Patient still complaining of RUQ pain/ nausea/ vomiting Very verbose   Objective: Vital signs in last 24 hours: Temp:  [97.6 F (36.4 C)-98.3 F (36.8 C)] 97.6 F (36.4 C) (08/18 0538) Pulse Rate:  [54-72] 64 (08/18 0538) Resp:  [12-26] 16 (08/18 0538) BP: (104-138)/(49-75) 133/69 mmHg (08/18 0538) SpO2:  [95 %-100 %] 97 % (08/18 0538) Weight:  [205 lb (92.987 kg)-209 lb 14.4 oz (95.21 kg)] 209 lb 14.4 oz (95.21 kg) (08/17 1943)    Intake/Output from previous day: 08/17 0701 - 08/18 0700 In: 3000 [I.V.:3000] Out: -  Intake/Output this shift:    General appearance: alert, cooperative and no distress GI: RUQ tenderness  Lab Results:   Recent Labs  10/22/13 1245 10/22/13 1305 10/23/13 0001  WBC 3.7*  --  3.7*  HGB 14.6 15.3 13.7  HCT 42.0 45.0 39.6  PLT 48*  --  44*   BMET  Recent Labs  10/22/13 1245 10/22/13 1305 10/23/13 0001  NA 139 138 141  K 4.2 4.0 4.0  CL 103 102 106  CO2 23  --  26  GLUCOSE 352* 377* 155*  BUN 8 7 8   CREATININE 0.84 0.90 0.81  CALCIUM 9.1  --  8.5   PT/INR  Recent Labs  10/22/13 2029  LABPROT 14.7  INR 1.15   ABG No results found for this basename: PHART, PCO2, PO2, HCO3,  in the last 72 hours  Studies/Results: Ct Abdomen Pelvis Wo Contrast  10/22/2013   CLINICAL DATA:  Left-sided abdominal pain. Chest pain. Shortness of breath.  EXAM: CT ABDOMEN AND PELVIS WITHOUT CONTRAST  TECHNIQUE: Multidetector CT imaging of the abdomen and pelvis was performed following the standard protocol without IV contrast.  COMPARISON:  06/26/2013.  FINDINGS: Musculoskeletal: No aggressive osseous lesions. Bilateral hip osteoarthritis.  Lung Bases: Respiratory motion at the lung bases. Dependent atelectasis. Scarring in the RIGHT middle lobe anteriorly.  Liver: Hepatic cirrhosis. No focal mass lesion identified on noncontrast imaging. No intrahepatic biliary ductal dilation. Dome of the liver suboptimally visualized due  to respiratory motion artifact. Unenhanced CT was performed per clinician order. Lack of IV contrast limits sensitivity and specificity, especially for evaluation of abdominal/pelvic solid viscera.  Spleen:  Splenomegaly with 15.5 cm splenic span.  Gallbladder: Distended with calcified stones layering dependently. No inflammatory changes of the gallbladder by noncontrast CT.  Common bile duct: No calcified stone in the common bile duct. No common bile duct dilation.  Pancreas:  Normal.  Adrenal glands:  Normal bilaterally.  Kidneys: Nonobstructing LEFT renal collecting system calculus in the inferior pole measuring 4 mm. LEFT ureter is normal. The RIGHT ureter is normal. No RIGHT renal calculi are identified.  Stomach: Distal esophageal thickening is nonspecific but most commonly associated with gastroesophageal reflux. No inflammatory changes of stomach grossly.  Small bowel: Duodenum appears normal. No small bowel obstruction. No mesenteric adenopathy.  Colon:   Normal appendix.  Large stool burden.  Pelvic Genitourinary: Prostate calcifications. Urinary bladder appears within normal limits.  Vasculature: Atherosclerosis. Findings compatible with portal venous hypertension with perisplenic collaterals in the LEFT upper quadrant. There also appears to be recanalization of the periumbilical vein although this appears small.  Body Wall: Tiny fat containing periumbilical hernia.  IMPRESSION: 1. Hepatic cirrhosis. 2. Cholelithiasis. 3. 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 Portal venous collaterals in the LEFT upper quadrant. 4. Nonobstructing LEFT renal calculi.   Electronically Signed   By: Dereck Ligas M.D.   On:  10/22/2013 17:19   US Abdomen Limited  10/22/2013   CLINICAL DATA:  Epigastric abdominal pain radiating into the flanks.  EXAM: US ABDOMEN LIMITED - RIGHT UPPER QUADRANT  COMPARISON:  CT 10/22/2013 and multiple prior CT exams. Ultrasound 03/21/2013.  FINDINGS:  Gallbladder:  Gallbladder wall measures 7 mm. In the setting of cirrhosis, this is a nonspecific finding. Sonographic Percell Miller sign is reported.  Common bile duct:  Diameter: 7 mm, within normal limits for age. No common duct stone is identified. The common bile duct size has increased compared 03/21/2013 ultrasound when it measured 4 mm.  Liver:  Hepatic cirrhosis.  No focal mass lesion is identified.  IMPRESSION: 1. Hepatic cirrhosis. 2. Gallbladder wall thickening is nonspecific in the setting of cirrhosis however in the presence of sonographic Murphy sign and gallstones on CT, these findings are suspicious for acute cholecystitis. No definite inflammatory changes were identified on CT today however CT is less sensitive than ultrasound for detection of acute cholecystitis. If the clinical scenario is equivocal, nuclear medicine HIDA scan could be considered to assess for cholecystitis. 3. Increasing size of the common bile duct compared to prior exam earlier this year. No common duct stone is identified.   Electronically Signed   By: Dereck Ligas M.D.   On: 10/22/2013 17:21   Dg Chest Portable 1 View  10/22/2013   CLINICAL DATA:  Shortness of breath.  EXAM: PORTABLE CHEST - 1 VIEW  COMPARISON:  05/04/2013.  FINDINGS: Trachea is midline. Heart size stable. Lungs are somewhat low in volume with mild bibasilar volume loss. No airspace consolidation or pleural fluid.  IMPRESSION: Low lung volumes with mild bibasilar volume loss.   Electronically Signed   By: Lorin Picket M.D.   On: 10/22/2013 13:22    Anti-infectives: Anti-infectives   Start     Dose/Rate Route Frequency Ordered Stop   10/22/13 1730  Ampicillin-Sulbactam (UNASYN) 3 g in sodium chloride 0.9 % 100 mL IVPB     3 g 100 mL/hr over 60 Minutes Intravenous  Once 10/22/13 1729 10/22/13 1834      Assessment/Plan: s/p Procedure(s): LAPAROSCOPIC CHOLECYSTECTOMY WITH INTRAOPERATIVE CHOLANGIOGRAM (N/A) Plan laparoscopic cholecystectomy with  intraoperative cholangiogram today The surgical procedure has been discussed with the patient.  Potential risks, benefits, alternative treatments, and expected outcomes have been explained.  All of the patient's questions at this time have been answered.  The likelihood of reaching the patient's treatment goal is good.  The patient understand the proposed surgical procedure and wishes to proceed.   LOS: 1 day    Bradley Green K. 10/23/2013

## 2013-10-23 NOTE — Progress Notes (Signed)
Pt has been very sleepy, "leave me alone, I'm tired". Pulled him up in bed. He woke up very agitated, screaming in pain, pulling at O2 and equipment. Will not answer questions. Med with IV Fentanyl with good effect. VSS, appears comfortable. Dr Tresa Moore at bedside-updated. OK to tx pt to floor.

## 2013-10-23 NOTE — H&P (Signed)
  PROGRESS NOTE MEDICINE TEACHING ATTENDING   Day 1 of stay Patient name: Bradley Green   Medical record number: [id number] Date of birth: 01-02-1956    Mr Meldrum - 58 years old male admitted with RUQ pain, nausea and vomiting of acute duration of 1 day. Labs and imaging consistent with acute cholecystitis. Surgery on board and patient scheduled for emergency lap cholecystectomy this morning. I met with patient this morning briefly before his departure for surgery. He is alert oriented, in no acute distress. Very very talkative patient. Feeling nauseous and still with 5/10 midline and right sided pain in his abdomen after pain meds. Vitals stable. Exam is significant for mild to moderate abdominal tenderness with voluntary guarding, epigastric, umbilical and RUQ region.  I have reviewed the chart, lab results, EKG, imaging and relevant notes of this patient.  I have reviewed HP by Dr Randell Patient, and I agree with her documentation and care plan. I have discussed the care of this patient with my IM team residents. Please see the resident note for details. We will follow up on this patient's care post surgery.  [place], [place] 10/23/2013, 9:50 AM.

## 2013-10-23 NOTE — Anesthesia Postprocedure Evaluation (Signed)
  Anesthesia Post-op Note  Patient: Bradley Green  Procedure(s) Performed: Procedure(s): LAPAROSCOPIC CHOLECYSTECTOMY WITH INTRAOPERATIVE CHOLANGIOGRAM (N/A)  Patient Location: PACU  Anesthesia Type:General  Level of Consciousness: awake, sedated and responds to stimulation  Airway and Oxygen Therapy: Patient Spontanous Breathing and Patient connected to nasal cannula oxygen  Post-op Pain: mild  Post-op Assessment: Post-op Vital signs reviewed, Patient's Cardiovascular Status Stable and Respiratory Function Stable  Post-op Vital Signs: Reviewed and stable  Last Vitals:  Filed Vitals:   10/23/13 1315  BP: 108/54  Pulse:   Temp:   Resp:     Complications: No apparent anesthesia complications

## 2013-10-23 NOTE — Progress Notes (Signed)
Subjective:    Currently, the patient reports continued abdominal pain associated with nausea and vomiting.  He was having an episode of emesis when I stopped by to see him this morning, but reports being hungry.  Denies hematemesis, chest tightness, shortness of breath, hematochezia.  Interval Events: -Seen by surgery who plans to perform cholecystectomy today after giving platelets. -VS stable.   Objective:    Vital Signs:   Temp:  [97.6 F (36.4 C)-98.3 F (36.8 C)] 97.9 F (36.6 C) (08/18 0954) Pulse Rate:  [54-72] 70 (08/18 0954) Resp:  [12-26] 16 (08/18 0538) BP: (104-142)/(49-75) 124/64 mmHg (08/18 0954) SpO2:  [95 %-100 %] 99 % (08/18 0954) Weight:  [205 lb (92.987 kg)-209 lb 14.4 oz (95.21 kg)] 209 lb 14.4 oz (95.21 kg) (08/17 1943)    24-hour weight change: Weight change:   Intake/Output:   Intake/Output Summary (Last 24 hours) at 10/23/13 1126 Last data filed at 10/23/13 0800  Gross per 24 hour  Intake   3000 ml  Output      0 ml  Net   3000 ml      Physical Exam: General: Vital signs reviewed and noted. Well-developed, well-nourished, in no acute distress; alert, appropriate and cooperative throughout examination.  Lungs:  Normal respiratory effort. Clear to auscultation BL without crackles or wheezes.  Heart: RRR. S1 and S2 normal without gallop, murmur, or rubs.  Abdomen:  Moderate tenderness on R side of abdomen with guarding, mild distension. BS normoactive. No masses or organomegaly.  Extremities: No pretibial edema.    Labs:  Basic Metabolic Panel:  Recent Labs Lab 10/22/13 1245 10/22/13 1305 10/23/13 0001  NA 139 138 141  K 4.2 4.0 4.0  CL 103 102 106  CO2 23  --  26  GLUCOSE 352* 377* 155*  BUN $Re'8 7 8  'roO$ CREATININE 0.84 0.90 0.81  CALCIUM 9.1  --  8.5    Liver Function Tests:  Recent Labs Lab 10/22/13 1245 10/23/13 0001  AST 54* 55*  ALT 41 40  ALKPHOS 180* 171*  BILITOT 0.8 1.4*  PROT 7.1 6.8  ALBUMIN 3.0* 2.9*     Recent Labs Lab 10/22/13 1245  LIPASE 64*   CBC:  Recent Labs Lab 10/22/13 1245 10/22/13 1305 10/23/13 0001  WBC 3.7*  --  3.7*  NEUTROABS 2.3  --   --   HGB 14.6 15.3 13.7  HCT 42.0 45.0 39.6  MCV 85.7  --  85.0  PLT 48*  --  44*   CBG:  Recent Labs Lab 10/22/13 1236 10/23/13 0535 10/23/13 0745  GLUCAP 344* 177* 166*    Microbiology: Results for orders placed during the hospital encounter of 10/22/13  SURGICAL PCR SCREEN     Status: None   Collection Time    10/23/13  3:10 AM      Result Value Ref Range Status   MRSA, PCR NEGATIVE  NEGATIVE Final   Staphylococcus aureus NEGATIVE  NEGATIVE Final   Comment:            The Xpert SA Assay (FDA     approved for NASAL specimens     in patients over 57 years of age),     is one component of     a comprehensive surveillance     program.  Test performance has     been validated by [employer] for patients greater     than or equal to 68 year old.  It is not intended     to diagnose infection nor to     guide or monitor treatment.    Coagulation Studies:  Recent Labs  10/22/13 2029  LABPROT 14.7  INR 1.15     Other results: EKG: normal EKG, normal sinus rhythm, unchanged from previous tracings.  Imaging: Ct Abdomen Pelvis Wo Contrast  10/22/2013   CLINICAL DATA:  Left-sided abdominal pain. Chest pain. Shortness of breath.  EXAM: CT ABDOMEN AND PELVIS WITHOUT CONTRAST  TECHNIQUE: Multidetector CT imaging of the abdomen and pelvis was performed following the standard protocol without IV contrast.  COMPARISON:  06/26/2013.  FINDINGS: Musculoskeletal: No aggressive osseous lesions. Bilateral hip osteoarthritis.  Lung Bases: Respiratory motion at the lung bases. Dependent atelectasis. Scarring in the RIGHT middle lobe anteriorly.  Liver: Hepatic cirrhosis. No focal mass lesion identified on noncontrast imaging. No intrahepatic biliary ductal dilation. Dome of the liver suboptimally visualized due to  respiratory motion artifact. Unenhanced CT was performed per clinician order. Lack of IV contrast limits sensitivity and specificity, especially for evaluation of abdominal/pelvic solid viscera.  Spleen:  Splenomegaly with 15.5 cm splenic span.  Gallbladder: Distended with calcified stones layering dependently. No inflammatory changes of the gallbladder by noncontrast CT.  Common bile duct: No calcified stone in the common bile duct. No common bile duct dilation.  Pancreas:  Normal.  Adrenal glands:  Normal bilaterally.  Kidneys: Nonobstructing LEFT renal collecting system calculus in the inferior pole measuring 4 mm. LEFT ureter is normal. The RIGHT ureter is normal. No RIGHT renal calculi are identified.  Stomach: Distal esophageal thickening is nonspecific but most commonly associated with gastroesophageal reflux. No inflammatory changes of stomach grossly.  Small bowel: Duodenum appears normal. No small bowel obstruction. No mesenteric adenopathy.  Colon:   Normal appendix.  Large stool burden.  Pelvic Genitourinary: Prostate calcifications. Urinary bladder appears within normal limits.  Vasculature: Atherosclerosis. Findings compatible with portal venous hypertension with perisplenic collaterals in the LEFT upper quadrant. There also appears to be recanalization of the periumbilical vein although this appears small.  Body Wall: Tiny fat containing periumbilical hernia.  IMPRESSION: 1. Hepatic cirrhosis. 2. Cholelithiasis. 3. 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 Portal venous collaterals in the LEFT upper quadrant. 4. Nonobstructing LEFT renal calculi.   Electronically Signed   By: Dereck Ligas M.D.   On: 10/22/2013 17:19   US Abdomen Limited  10/22/2013   CLINICAL DATA:  Epigastric abdominal pain radiating into the flanks.  EXAM: US ABDOMEN LIMITED - RIGHT UPPER QUADRANT  COMPARISON:  CT 10/22/2013 and multiple prior CT exams. Ultrasound 03/21/2013.  FINDINGS: Gallbladder:   Gallbladder wall measures 7 mm. In the setting of cirrhosis, this is a nonspecific finding. Sonographic Percell Miller sign is reported.  Common bile duct:  Diameter: 7 mm, within normal limits for age. No common duct stone is identified. The common bile duct size has increased compared 03/21/2013 ultrasound when it measured 4 mm.  Liver:  Hepatic cirrhosis.  No focal mass lesion is identified.  IMPRESSION: 1. Hepatic cirrhosis. 2. Gallbladder wall thickening is nonspecific in the setting of cirrhosis however in the presence of sonographic Murphy sign and gallstones on CT, these findings are suspicious for acute cholecystitis. No definite inflammatory changes were identified on CT today however CT is less sensitive than ultrasound for detection of acute cholecystitis. If the clinical scenario is equivocal, nuclear medicine HIDA scan could be considered to assess for cholecystitis. 3. Increasing size of the common bile duct compared  to prior exam earlier this year. No common duct stone is identified.   Electronically Signed   By: Dereck Ligas M.D.   On: 10/22/2013 17:21   Dg Chest Portable 1 View  10/22/2013   CLINICAL DATA:  Shortness of breath.  EXAM: PORTABLE CHEST - 1 VIEW  COMPARISON:  05/04/2013.  FINDINGS: Trachea is midline. Heart size stable. Lungs are somewhat low in volume with mild bibasilar volume loss. No airspace consolidation or pleural fluid.  IMPRESSION: Low lung volumes with mild bibasilar volume loss.   Electronically Signed   By: Lorin Picket M.D.   On: 10/22/2013 13:22     Medications:    Infusions: . sodium chloride 1,000 mL (10/23/13 0305)  . lactated ringers      Scheduled Medications: . [MAR HOLD] sodium chloride   Intravenous Once  . [MAR HOLD] insulin aspart  0-15 Units Subcutaneous 6 times per day  . [facility] HOLD] pneumococcal 23 valent vaccine  0.5 mL Intramuscular Tomorrow-1000  . [facility] HOLD] tamsulosin  0.4 mg Oral QPC supper    PRN Medications: 0.9 % irrigation (POUR  BTL), bupivacaine-EPINEPHrine, [MAR HOLD]  HYDROmorphone (DILAUDID) injection, [MAR HOLD]  HYDROmorphone (DILAUDID) injection, Omnipaque 300 mg/mL (50 mL) in 0.9% normal saline (50 mL), [MAR HOLD] ondansetron (ZOFRAN) IV, [MAR HOLD] ondansetron, sodium chloride irrigation   Assessment/ Plan:    Active Problems:   Cholecystitis   Acute cholecystitis   #Acute cholecystitis RUQ pain associated with nausea and vomiting. US showed gallbladder wall thickening and sonographic Murphy's sign.  Gallstones on CT.  Patient remains afebrile, normal WBC without tachycardia.  LFTs at baseline with normal T bili and no signs of obstruction. Lipase of 64, so pancreatitis unlikely.  Gen surgery consulted and agree with diagnosis of cholecystitis.  They are planning on cholecystectomy today with possible discharge home tomorrow.  Gen surg ordered platelets prior to the procedure given chronic thrombocytopenia (48 on admission).  Unasyn started, will continue until surgery and seek gen surg recs for continued therapy afterwards. -Appreciate gen surg help with care. -Transfuse 2 units of platelets today. -NPO for lap chole today. -Continue pain control with dilaudid 0.5-1 mg q2h prn  -Continue Unasyn until surgery, appreciate surgery recs on post-operative ABX.  -Continue Zofran 4 mg q6h prn   #BPH Reports urinary frequency and incomplete emptying.  U/A negative and non-obstruction L renal calculus on CT scan. -Continue Flomax 0.4mg  daily   #Renal stones s/p lithotripsy Nonobstructing left renal calculus on CT scan. 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 Calcium oxalate crystals in UA. Likely not causing current pain and N/V. -Continue to monitor   #DM2 No hgb A1c in EMR. Pt states BG runs in the 200s usually. 300-500 for the past couple weeks.  Takes 60 units Lantus BID at home and 30 units Novolog three times daily before meals. -Hgb A1c ordered for tomorrow morning. -SSI moderate. -CBG  q4hrs while NPO. -Consider starting home Lantus tonight depending on status after procedure.  #Cirrhosis Previous GI notes say that the cause of his cirrhosis is unknown, but likely steatohepatitis after results of biopsy.  Remote history of alcohol abuse, but no drinking in 20 years.  AST 54 (baseline 50) and alk phos 180 (baseline 170) and ALT and t. bili wnl on admission. -Continue to monitor .  #Thrombocytopenia Cause unclear, patient reports negative work-up previously.  Consistently ~50 for several years.  Potentially related to cirrhosis. -Platelets per gen surg above.   DVT PPX -  SCD's while in bed  CODE STATUS - Full code.  CONSULTS PLACED - Gen surg.  DISPO - Disposition is deferred at this time, awaiting improvement of cholecystitis.   Anticipated discharge in approximately 2 day(s).   The patient does have a current PCP Chriss Czar, MD) and does not need an [facility] hospital follow-up appointment after discharge.    Is the [facility] hospital follow-up appointment a one-time only appointment? not applicable.  Does the patient have transportation limitations that hinder transportation to clinic appointments? no   SERVICE NEEDED AT [place]         Y = Yes, Blank = No PT:   OT:   RN:   Equipment:   Other:      Length of Stay: 1 day(s)   Signed: Arman Filter, MD  PGY-1, Internal Medicine Resident Pager: [phone] (7AM-5PM) 10/23/2013, 11:26 AM

## 2013-10-23 NOTE — H&P (Signed)
Date: 10/23/2013               Patient Name:  Bradley Green MRN: [id number]  DOB: 1956-02-04 Age / Sex: 58 y.o., male   PCP: Chriss Czar, MD         Medical Service: Internal Medicine Teaching Service         Attending Physician: Dr. Madilyn Fireman, MD    First Contact: Dr. Trudee Kuster Pager: [phone]  Second Contact: Dr. Alice Rieger Pager: [phone]       After Hours (After 5p/  First Contact Pager: [phone]  weekends / holidays): Second Contact Pager: [phone]   Chief Complaint: Abdominal pain  History of Present Illness: Mr. Benassi is a 58 year old man with history of renal stones s/p lithotripsy, DM, cirrhosis presenting with RUQ abdominal pain, nausea, vomiting x 1 day. The abdominal pain started this AM around 2 hours after eating some chili. He had bloating on the left side of his abdomen. He had emesis x 1 prior to arrival, nonbloody, nonbilious. He has been having intermittent LUQ/RUQ pain for the past 3 weeks. Endorses waking up in the middle of the night with abdominal pain. Denies eating greasy foods. Denies fevers/chills. He has been taking care of his wife who recently had a knee replacement several weeks ago. Denies chest pain, shortness of breath, diarrhea, dysuria. He has urinary frequency and incomplete emptying.   Meds: Novolog 30u TID Lantus 60u BID  Allergies: Allergies as of 10/22/2013 - Review Complete 10/22/2013  Allergen Reaction Noted  . Codeine  01/13/2010  . Morphine  01/13/2010  . Pork-derived products Hives 03/29/2011  . Propoxyphene n-acetaminophen  01/13/2010   Past Medical History  Diagnosis Date  . Kidney stones   . Diabetes mellitus   . Cirrhosis   . Adenomatous colon polyp    Past Surgical History  Procedure Laterality Date  . Liver biopsy    . Rotator cuff repair    . Lithotripsy     Family History  Problem Relation Age of Onset  . Colon cancer Neg Hx    History   Social History  . Marital Status: Divorced    Spouse Name: N/A      Number of Children: 1  . Years of Education: N/A   Occupational History  . Dealer    Social History Main Topics  . Smoking status: Never Smoker   . Smokeless tobacco: Current User    Types: Chew     Comment: Info given on 03-29-11  . Alcohol Use: No  . Drug Use: No  . Sexual Activity: Not on file   Other Topics Concern  . Not on file   Social History Narrative  . No narrative on file    Review of Systems: Constitutional: no fevers/chills Eyes: no vision changes Ears, nose, mouth, throat, and face: no cough Respiratory: no shortness of breath Cardiovascular: no chest pain Gastrointestinal: +nausea/vomiting, +abdominal pain, no constipation, no diarrhea Genitourinary: no dysuria, no hematuria Integument: no rash Hematologic/lymphatic: no bleeding/bruising, no edema Musculoskeletal: +back pain, no myalgias Neurological: no paresthesias  Physical Exam: Blood pressure 138/71, pulse 58, temperature 97.7 F (36.5 C), temperature source Oral, resp. rate 16, height $RemoveBe'5\' 7"'mRblnDJRS$  (1.702 m), weight 209 lb 14.4 oz (95.21 kg), SpO2 100.00%. General Apperance: NAD Head: Normocephalic, atraumatic Eyes: PERRL, EOMI, anicteric sclera Ears: Nares normal, septum midline, mucosa normal Throat: Lips, mucosa and tongue normal  Neck: Supple, trachea midline Back: No tenderness or bony abnormality  Lungs: Clear to auscultation bilaterally. No  wheezes, rhonchi or rales. Breathing comfortably Chest Wall: Nontender, no deformity Heart: Regular rate and rhythm, no murmur/rub/gallop Abdomen: Soft, moderately tender to palpation in RUQ, mildly distended, no rebound, +voluntary guarding Extremities: Normal, atraumatic, warm and well perfused, no edema Pulses: 2+ throughout Skin: No rashes or lesions Neurologic: Alert and oriented x 3. CNII-XII intact. Normal strength and sensation  Lab results: Basic Metabolic Panel:  Recent Labs  84/53/64 1245 10/22/13 1305  NA 139 138  K 4.2 4.0  CL  103 102  CO2 23  --   GLUCOSE 352* 377*  BUN 8 7  CREATININE 0.84 0.90  CALCIUM 9.1  --    Liver Function Tests:  Recent Labs  10/22/13 1245  AST 54*  ALT 41  ALKPHOS 180*  BILITOT 0.8  PROT 7.1  ALBUMIN 3.0*    Recent Labs  10/22/13 1245  LIPASE 64*    CBC:  Recent Labs  10/22/13 1245 10/22/13 1305  WBC 3.7*  --   NEUTROABS 2.3  --   HGB 14.6 15.3  HCT 42.0 45.0  MCV 85.7  --   PLT 48*  --    CBG:  Recent Labs  10/22/13 1236  GLUCAP 344*   Coagulation:  Recent Labs  10/22/13 2029  LABPROT 14.7  INR 1.15   Urine Drug Screen: Drugs of Abuse     Component Value Date/Time   LABOPIA NONE DETECTED 10/22/2013 2140   COCAINSCRNUR NONE DETECTED 10/22/2013 2140   LABBENZ NONE DETECTED 10/22/2013 2140   AMPHETMU NONE DETECTED 10/22/2013 2140   THCU NONE DETECTED 10/22/2013 2140   LABBARB NONE DETECTED 10/22/2013 2140    Urinalysis:  Recent Labs  10/22/13 1400  COLORURINE YELLOW  LABSPEC 1.030  PHURINE 5.5  GLUCOSEU >1000*  HGBUR NEGATIVE  BILIRUBINUR NEGATIVE  KETONESUR NEGATIVE  PROTEINUR NEGATIVE  UROBILINOGEN 0.2  NITRITE NEGATIVE  LEUKOCYTESUR NEGATIVE    Imaging results:  Ct Abdomen Pelvis Wo Contrast  10/22/2013   CLINICAL DATA:  Left-sided abdominal pain. Chest pain. Shortness of breath.  EXAM: CT ABDOMEN AND PELVIS WITHOUT CONTRAST  TECHNIQUE: Multidetector CT imaging of the abdomen and pelvis was performed following the standard protocol without IV contrast.  COMPARISON:  06/26/2013.  FINDINGS: Musculoskeletal: No aggressive osseous lesions. Bilateral hip osteoarthritis.  Lung Bases: Respiratory motion at the lung bases. Dependent atelectasis. Scarring in the RIGHT middle lobe anteriorly.  Liver: Hepatic cirrhosis. No focal mass lesion identified on noncontrast imaging. No intrahepatic biliary ductal dilation. Dome of the liver suboptimally visualized due to respiratory motion artifact. Unenhanced CT was performed per clinician order. Lack  of IV contrast limits sensitivity and specificity, especially for evaluation of abdominal/pelvic solid viscera.  Spleen:  Splenomegaly with 15.5 cm splenic span.  Gallbladder: Distended with calcified stones layering dependently. No inflammatory changes of the gallbladder by noncontrast CT.  Common bile duct: No calcified stone in the common bile duct. No common bile duct dilation.  Pancreas:  Normal.  Adrenal glands:  Normal bilaterally.  Kidneys: Nonobstructing LEFT renal collecting system calculus in the inferior pole measuring 4 mm. LEFT ureter is normal. The RIGHT ureter is normal. No RIGHT renal calculi are identified.  Stomach: Distal esophageal thickening is nonspecific but most commonly associated with gastroesophageal reflux. No inflammatory changes of stomach grossly.  Small bowel: Duodenum appears normal. No small bowel obstruction. No mesenteric adenopathy.  Colon:   Normal appendix.  Large stool burden.  Pelvic Genitourinary: Prostate calcifications. Urinary bladder appears within normal limits.  Vasculature: Atherosclerosis. Findings  compatible with portal venous hypertension with perisplenic collaterals in the LEFT upper quadrant. There also appears to be recanalization of the periumbilical vein although this appears small.  Body Wall: Tiny fat containing periumbilical hernia.  IMPRESSION: 1. Hepatic cirrhosis. 2. Cholelithiasis. 3. 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 Portal venous collaterals in the LEFT upper quadrant. 4. Nonobstructing LEFT renal calculi.   Electronically Signed   By: Dereck Ligas M.D.   On: 10/22/2013 17:19   US Abdomen Limited  10/22/2013   CLINICAL DATA:  Epigastric abdominal pain radiating into the flanks.  EXAM: US ABDOMEN LIMITED - RIGHT UPPER QUADRANT  COMPARISON:  CT 10/22/2013 and multiple prior CT exams. Ultrasound 03/21/2013.  FINDINGS: Gallbladder:  Gallbladder wall measures 7 mm. In the setting of cirrhosis, this is a nonspecific  finding. Sonographic Percell Miller sign is reported.  Common bile duct:  Diameter: 7 mm, within normal limits for age. No common duct stone is identified. The common bile duct size has increased compared 03/21/2013 ultrasound when it measured 4 mm.  Liver:  Hepatic cirrhosis.  No focal mass lesion is identified.  IMPRESSION: 1. Hepatic cirrhosis. 2. Gallbladder wall thickening is nonspecific in the setting of cirrhosis however in the presence of sonographic Murphy sign and gallstones on CT, these findings are suspicious for acute cholecystitis. No definite inflammatory changes were identified on CT today however CT is less sensitive than ultrasound for detection of acute cholecystitis. If the clinical scenario is equivocal, nuclear medicine HIDA scan could be considered to assess for cholecystitis. 3. Increasing size of the common bile duct compared to prior exam earlier this year. No common duct stone is identified.   Electronically Signed   By: Dereck Ligas M.D.   On: 10/22/2013 17:21   Dg Chest Portable 1 View  10/22/2013   CLINICAL DATA:  Shortness of breath.  EXAM: PORTABLE CHEST - 1 VIEW  COMPARISON:  05/04/2013.  FINDINGS: Trachea is midline. Heart size stable. Lungs are somewhat low in volume with mild bibasilar volume loss. No airspace consolidation or pleural fluid.  IMPRESSION: Low lung volumes with mild bibasilar volume loss.   Electronically Signed   By: Lorin Picket M.D.   On: 10/22/2013 13:22    Other results: EKG: normal EKG, normal sinus rhythm, there are no previous tracings available for comparison.  Assessment & Plan by Problem: Active Problems:   Cholecystitis   Acute cholecystitis  RUQ abdominal pain, nausea, vomiting: Tender in RUQ with voluntary guarding. US abdomen demonstrates gallbladder wall thickening, sonographic Murphy sign, and gallstones seen on CT. Suspicious for acute cholecystitis. Afebrile, HR normal, RR normal. WBC 3.7. Does not meet SIRS criteria. LFTs at baseline  - unlikely acute hepatitis. T bili normal and no evidence of CBD dilation or stone - unlikely to have cholangitis/choledocholithiasis. Lipase 64 - unlikely to have pancreatitis. Other differential include gastritis or PUD. Surgery consulted by ED. -Per surgery, likely lap chole. Platelets on call to OR, NPO after 1am, type and screen -Pain control with dilaudid prn -Continue Unasyn -Zofran prn  Urinary frequency and incomplete emptying: UA negative. Nonobstructing left renal calculus on CT scan. Likely 2/2 BPH.  -Flomax 0.$RemoveBe'4mg'ueMaXUWOh$  daily  Renal stones s/p lithotripsy: nonobstructing left renal calculus on CT scan. 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 Calcium oxalate crystals in UA. - Continue to monitor  DM: no hgb A1c in EMR. Pt states BG runs in the 200s usually. 300-500 for the past couple weeks. - Obtain Hgb A1c -  SSI, Accuchecks Q4hrs while NPO  Cirrhosis: AST 54 (baseline 50) and alk phos 180 (baseline 170). ALT and t. bili wnl. Child Class A. - Continue to monitor  FEN: -NPO -NS$RemoveBefo'@125ml'bHKjNVHQRzr$ /hr   DVT ppx: SCDs  Dispo: Disposition is deferred at this time, awaiting improvement of current medical problems. Anticipated discharge in approximately 1 day(s).   The patient does have a current PCP Chriss Czar, MD) and does not need an [facility] hospital follow-up appointment after discharge.  The patient does not know have transportation limitations that hinder transportation to clinic appointments.  Signed: Jacques Earthly, MD 10/23/2013, 12:14 AM

## 2013-10-23 NOTE — Anesthesia Procedure Notes (Signed)
Procedure Name: Intubation Date/Time: 10/23/2013 11:35 AM Performed by: Manuela Schwartz B Pre-anesthesia Checklist: Patient identified, Emergency Drugs available, Suction available, Patient being monitored and Timeout performed Patient Re-evaluated:Patient Re-evaluated prior to inductionOxygen Delivery Method: Circle system utilized Preoxygenation: Pre-oxygenation with 100% oxygen Intubation Type: IV induction and Rapid sequence Laryngoscope Size: Mac and 3 Grade View: Grade I Tube type: Oral Tube size: 7.5 mm Number of attempts: 1 Airway Equipment and Method: Stylet Placement Confirmation: ETT inserted through vocal cords under direct vision,  positive ETCO2 and breath sounds checked- equal and bilateral Secured at: 22 cm Tube secured with: Tape Dental Injury: Teeth and Oropharynx as per pre-operative assessment

## 2013-10-23 NOTE — Transfer of Care (Signed)
Immediate Anesthesia Transfer of Care Note  Patient: Bradley Green  Procedure(s) Performed: Procedure(s): LAPAROSCOPIC CHOLECYSTECTOMY WITH INTRAOPERATIVE CHOLANGIOGRAM (N/A)  Patient Location: PACU  Anesthesia Type:General  Level of Consciousness: responds to stimulation  Airway & Oxygen Therapy: Patient Spontanous Breathing and Patient connected to nasal cannula oxygen  Post-op Assessment: Report given to PACU RN and Post -op Vital signs reviewed and stable  Post vital signs: Reviewed and stable  Complications: No apparent anesthesia complications

## 2013-10-23 NOTE — Op Note (Signed)
Laparoscopic Cholecystectomy with IOC Procedure Note  Indications: This patient presents with symptomatic gallbladder disease and will undergo laparoscopic cholecystectomy.  Pre-operative Diagnosis: Calculus of gallbladder with acute cholecystitis, without mention of obstruction                                      Hepatic cirrhosis Post-operative Diagnosis: Same  Surgeon: Garvis Downum K.   Assistants: Dr. Nedra Hai  Anesthesia: General endotracheal anesthesia  ASA Class: 3  Procedure Details  The patient was seen again in the Holding Room. The risks, benefits, complications, treatment options, and expected outcomes were discussed with the patient. The possibilities of reaction to medication, pulmonary aspiration, perforation of viscus, bleeding, recurrent infection, finding a normal gallbladder, the need for additional procedures, failure to diagnose a condition, the possible need to convert to an open procedure, and creating a complication requiring transfusion or operation were discussed with the patient. The likelihood of improving the patient's symptoms with return to their baseline status is good.  The patient and/or family concurred with the proposed plan, giving informed consent. The site of surgery properly noted. The patient was taken to Operating Room, identified as Bradley Green and the procedure verified as Laparoscopic Cholecystectomy with Intraoperative Cholangiogram. A Time Out was held and the above information confirmed.  He was given two units of pheresed platelets in the holding area prior to induction for a platelet count of 44.   Prior to the induction of general anesthesia, antibiotic prophylaxis was administered. General endotracheal anesthesia was then administered and tolerated well. After the induction, the abdomen was prepped with Chloraprep and draped in the sterile fashion. The patient was positioned in the supine position.  Local anesthetic agent was  injected into the skin near the umbilicus and an incision made. We dissected down to the abdominal fascia with blunt dissection.  The fascia was incised vertically and we entered the peritoneal cavity bluntly.  A pursestring suture of 0-Vicryl was placed around the fascial opening.  The Hasson cannula was inserted and secured with the stay suture.  Pneumoperitoneum was then created with CO2 and tolerated well without any adverse changes in the patient's vital signs. An 11-mm port was placed in the subxiphoid position.  Two 5-mm ports were placed in the right upper quadrant. All skin incisions were infiltrated with a local anesthetic agent before making the incision and placing the trocars.   We positioned the patient in reverse Trendelenburg, tilted slightly to the patient's left.  The liver was extremely nodular and atrophied. The gallbladder was identified, the fundus grasped and retracted cephalad. Adhesions were lysed bluntly and with the electrocautery where indicated, taking care not to injure any adjacent organs or viscus. There was considerable oozing from the adhesions.  The infundibulum was grasped and retracted laterally, exposing the peritoneum overlying the triangle of Calot. This was then divided and exposed in a blunt fashion. A critical view of the cystic duct and cystic artery was obtained.  The cystic duct was clearly identified and bluntly dissected circumferentially. The cystic duct was ligated with a clip distally.   An incision was made in the cystic duct and the [facility] cholangiogram catheter introduced. The catheter was secured using a clip. A cholangiogram was then obtained which showed good visualization of the distal and proximal biliary tree with no sign of filling defects or obstruction.  Contrast flowed easily into the duodenum. The catheter was then  removed.   The cystic duct was then ligated with clips and divided. The cystic artery was identified, dissected free, ligated with clips  and divided as well.   The gallbladder was dissected from the liver bed in retrograde fashion with the electrocautery. The gallbladder was removed and placed in an Endocatch sac. The liver bed was irrigated and inspected. Hemostasis was achieved with the electrocautery and SNOW hemostatic agent. Copious irrigation was utilized and was repeatedly aspirated until clear.  The gallbladder and Endocatch sac were then removed through the umbilical port site.  The pursestring suture was used to close the umbilical fascia.    We again inspected the right upper quadrant for hemostasis.  Pneumoperitoneum was released as we removed the trocars.  4-0 Monocryl was used to close the skin.   Benzoin, steri-strips, and clean dressings were applied. The patient was then extubated and brought to the recovery room in stable condition. Instrument, sponge, and needle counts were correct at closure and at the conclusion of the case.   Findings: Cholecystitis with Cholelithiasis; severe hepatic cirrhosis  Estimated Blood Loss: 200 mL         Drains: none         Specimens: Gallbladder           Complications: None; patient tolerated the procedure well.         Disposition: PACU - hemodynamically stable.         Condition: stable  [name]. Georgette Dover, MD, [facility]  General/ Trauma Surgery  10/23/2013 1:07 PM

## 2013-10-23 NOTE — Anesthesia Preprocedure Evaluation (Signed)
Anesthesia Evaluation   Patient awake    Reviewed: Allergy & Precautions, H&P , NPO status , Patient's Chart, lab work & pertinent test results, reviewed documented beta blocker date and time   Airway       Dental   Pulmonary          Cardiovascular     Neuro/Psych    GI/Hepatic (+) Cirrhosis -      , Also spleenomegaly and portal HTN   Endo/Other  diabetes, Type 2, Insulin Dependent  Renal/GU      Musculoskeletal   Abdominal   Peds  Hematology Low platlets, probably secondary spleenomegaly.  To get platelets prior to surg   Anesthesia Other Findings   Reproductive/Obstetrics                           Anesthesia Physical Anesthesia Plan  ASA: III  Anesthesia Plan: General   Post-op Pain Management:    Induction: Intravenous  Airway Management Planned: Oral ETT  Additional Equipment:   Intra-op Plan:   Post-operative Plan: Extubation in OR  Informed Consent: I have reviewed the patients History and Physical, chart, labs and discussed the procedure including the risks, benefits and alternatives for the proposed anesthesia with the patient or authorized representative who has indicated his/her understanding and acceptance.     Plan Discussed with:   Anesthesia Plan Comments: (To get platelets before surg)        Anesthesia Quick Evaluation

## 2013-10-24 ENCOUNTER — Encounter (HOSPITAL_COMMUNITY): Payer: Self-pay | Admitting: Surgery

## 2013-10-24 DIAGNOSIS — D696 Thrombocytopenia, unspecified: Secondary | ICD-10-CM

## 2013-10-24 DIAGNOSIS — N400 Enlarged prostate without lower urinary tract symptoms: Secondary | ICD-10-CM

## 2013-10-24 DIAGNOSIS — E119 Type 2 diabetes mellitus without complications: Secondary | ICD-10-CM

## 2013-10-24 LAB — PREPARE PLATELET PHERESIS
UNIT DIVISION: 0
Unit division: 0

## 2013-10-24 LAB — GLUCOSE, CAPILLARY
GLUCOSE-CAPILLARY: 184 mg/dL — AB (ref 70–99)
GLUCOSE-CAPILLARY: 241 mg/dL — AB (ref 70–99)
Glucose-Capillary: 241 mg/dL — ABNORMAL HIGH (ref 70–99)
Glucose-Capillary: 286 mg/dL — ABNORMAL HIGH (ref 70–99)
Glucose-Capillary: 359 mg/dL — ABNORMAL HIGH (ref 70–99)

## 2013-10-24 LAB — COMPREHENSIVE METABOLIC PANEL
ALBUMIN: 2.9 g/dL — AB (ref 3.5–5.2)
ALT: 38 U/L (ref 0–53)
ANION GAP: 13 (ref 5–15)
AST: 63 U/L — ABNORMAL HIGH (ref 0–37)
Alkaline Phosphatase: 146 U/L — ABNORMAL HIGH (ref 39–117)
BILIRUBIN TOTAL: 1.4 mg/dL — AB (ref 0.3–1.2)
BUN: 15 mg/dL (ref 6–23)
CALCIUM: 9.5 mg/dL (ref 8.4–10.5)
CO2: 21 mEq/L (ref 19–32)
Chloride: 101 mEq/L (ref 96–112)
Creatinine, Ser: 0.93 mg/dL (ref 0.50–1.35)
GFR calc Af Amer: >90 mL/min (ref >90)
GFR calc non Af Amer: >90 mL/min (ref >90)
Glucose, Bld: 223 mg/dL — ABNORMAL HIGH (ref 70–99)
Potassium: 4.9 mEq/L (ref 3.7–5.3)
Sodium: 135 mEq/L — ABNORMAL LOW (ref 137–147)
TOTAL PROTEIN: 6.9 g/dL (ref 6.0–8.3)

## 2013-10-24 LAB — CBC
HCT: 37.1 % — ABNORMAL LOW (ref 39.0–52.0)
HEMOGLOBIN: 12.7 g/dL — AB (ref 13.0–17.0)
MCH: 29.6 pg (ref 26.0–34.0)
MCHC: 34.2 g/dL (ref 30.0–36.0)
MCV: 86.5 fL (ref 78.0–100.0)
Platelets: 67 10*3/uL — ABNORMAL LOW (ref 150–400)
RBC: 4.29 MIL/uL (ref 4.22–5.81)
RDW: 14.6 % (ref 11.5–15.5)
WBC: 6.9 10*3/uL (ref 4.0–10.5)

## 2013-10-24 LAB — URINE CULTURE
CULTURE: NO GROWTH
Colony Count: NO GROWTH

## 2013-10-24 LAB — HEMOGLOBIN A1C
HEMOGLOBIN A1C: 11.3 % — AB (ref <5.7)
Mean Plasma Glucose: 278 mg/dL — ABNORMAL HIGH (ref <117)

## 2013-10-24 MED ORDER — TAMSULOSIN HCL 0.4 MG PO CAPS: 0.4000 mg | ORAL_CAPSULE | Freq: Every day | ORAL | Status: AC

## 2013-10-24 MED ORDER — HYDROMORPHONE HCL PF 1 MG/ML IJ SOLN
1.0000 mg | Freq: Once | INTRAMUSCULAR | Status: AC
  Administered 2013-10-24: 1 mg via INTRAVENOUS

## 2013-10-24 MED ORDER — OXYCODONE HCL 5 MG PO TABS
5.0000 mg | ORAL_TABLET | ORAL | Status: DC | PRN
  Administered 2013-10-24: 10 mg via ORAL
  Filled 2013-10-24: qty 2

## 2013-10-24 MED ORDER — HYDROMORPHONE HCL 4 MG PO TABS
4.0000 mg | ORAL_TABLET | ORAL | Status: DC | PRN
Start: 2013-10-24 — End: 2013-10-31

## 2013-10-24 MED ORDER — DIPHENHYDRAMINE HCL 25 MG PO CAPS: 25.0000 mg | ORAL_CAPSULE | Freq: Four times a day (QID) | ORAL | Status: DC | PRN

## 2013-10-24 MED ORDER — HYDROMORPHONE HCL 2 MG PO TABS
4.0000 mg | ORAL_TABLET | ORAL | Status: DC | PRN
  Administered 2013-10-24: 4 mg via ORAL
  Filled 2013-10-24: qty 2

## 2013-10-24 MED ORDER — DIPHENHYDRAMINE HCL 25 MG PO CAPS: 25.0000 mg | ORAL_CAPSULE | Freq: Four times a day (QID) | ORAL | Status: AC | PRN

## 2013-10-24 MED ORDER — AMOXICILLIN-POT CLAVULANATE 500-125 MG PO TABS
1.0000 | ORAL_TABLET | Freq: Two times a day (BID) | ORAL | Status: DC
  Administered 2013-10-24: 500 mg via ORAL
  Filled 2013-10-24 (×2): qty 1

## 2013-10-24 MED ORDER — ONDANSETRON HCL 4 MG PO TABS: 4.0000 mg | ORAL_TABLET | Freq: Three times a day (TID) | ORAL | Status: AC | PRN

## 2013-10-24 MED ORDER — DIPHENHYDRAMINE HCL 25 MG PO CAPS
25.0000 mg | ORAL_CAPSULE | Freq: Once | ORAL | Status: AC
  Administered 2013-10-24: 25 mg via ORAL
  Filled 2013-10-24: qty 1

## 2013-10-24 MED ORDER — AMOXICILLIN-POT CLAVULANATE 500-125 MG PO TABS: 1.0000 | ORAL_TABLET | Freq: Two times a day (BID) | ORAL | Status: AC

## 2013-10-24 NOTE — Discharge Summary (Signed)
Name: Bradley Green MRN: [id number] DOB: July 04, 1955 58 y.o. PCP: Chriss Czar, MD  Date of Admission: 10/22/2013 12:29 PM Date of Discharge: 10/24/2013 Attending Physician: Madilyn Fireman, MD  Discharge Diagnosis:  Principal Problem:   Acute cholecystitis Active Problems:   Cirrhosis   Type 2 diabetes mellitus   BPH (benign prostatic hyperplasia)   Thrombocytopenia, unspecified  Discharge Medications:   Medication List         amoxicillin-clavulanate 500-125 MG per tablet  Commonly known as:  AUGMENTIN  Take 1 tablet (500 mg total) by mouth every 12 (twelve) hours.     diphenhydrAMINE 25 mg capsule  Commonly known as:  BENADRYL  Take 1 capsule (25 mg total) by mouth every 6 (six) hours as needed for itching.     HYDROmorphone 4 MG tablet  Commonly known as:  DILAUDID  Take 1 tablet (4 mg total) by mouth every 4 (four) hours as needed for severe pain.     insulin aspart 100 UNIT/ML injection  Commonly known as:  novoLOG  Inject 30 Units into the skin 3 (three) times daily before meals.     LANTUS 100 UNIT/ML injection  Generic drug:  insulin glargine  Inject 60 Units into the skin 2 (two) times daily.     ondansetron 4 MG tablet  Commonly known as:  ZOFRAN  Take 1 tablet (4 mg total) by mouth every 8 (eight) hours as needed for nausea.     tamsulosin 0.4 MG Caps capsule  Commonly known as:  FLOMAX  Take 1 capsule (0.4 mg total) by mouth daily after supper.        Disposition and follow-up:   [name] was discharged from [facility] in Stable condition.  At the hospital follow up visit please address:  1.  Pain control, completion of antibiotics (ending 10/30/13), assess for bleeding given thrombocytopenia, follow up with PCP through [facility] for referral to endocrinologist.  2.  Labs / imaging needed at time of follow-up: CBC, CMP.  3.  Pending labs/ test needing follow-up: None.  Follow-up Appointments: Follow-up Information     Follow up with [facility] On 11/20/2013. (arrive by 1PM for a 1:30PM post operative check up.  be sure to arrive at 1P to complete paperwork)    Contact information:   [address] [place]   [place] [address] [phone]       Follow up with [name], MD On 10/31/2013. (9:45 am)    Specialty:  Internal Medicine   Contact information:   [place] [address] [phone]       Discharge Instructions: Discharge Instructions   Call MD for:  difficulty breathing, headache or visual disturbances    Complete by:  As directed      Call MD for:  extreme fatigue    Complete by:  As directed      Call MD for:  hives    Complete by:  As directed      Call MD for:  persistant dizziness or light-headedness    Complete by:  As directed      Call MD for:  persistant nausea and vomiting    Complete by:  As directed      Call MD for:  redness, tenderness, or signs of infection (pain, swelling, redness, odor or green/yellow discharge around incision site)    Complete by:  As directed      Call MD for:  severe uncontrolled pain    Complete by:  As directed      Call MD for:  temperature >100.4    Complete by:  As directed      Diet - low sodium heart healthy    Complete by:  As directed      Increase activity slowly    Complete by:  As directed            Thank you for allowing [place] to be involved in your healthcare while you were hospitalized at [facility].   Please note that there have been changes to your home medications.  --> PLEASE LOOK AT YOUR DISCHARGE MEDICATION LIST FOR DETAILS.   Please call your PCP if you have any questions or concerns, or any difficulty getting any of your medications.  Please return to the ER if you have worsening of your symptoms or new severe symptoms arise.  Pick up your antibiotics and take all of the pills that we prescribed.  You should complete this in one week.  We arranged a  follow-up appointment in the [facility] but you will also need to see you primary care doctor in September.  Please make sure your PCP refers you to an endocrinologist in the [place] system to further address your diabetes.  I wrote you a prescription for Dilaudid for pain relief, but your pain should improve soon.  If you continue to have pain after running out of medication or the Dilaudid is not working, call the [facility] and we can see you sooner.  Consultations: General surgery.  Procedures Performed:  Ct Abdomen Pelvis Wo Contrast  10/22/2013   CLINICAL DATA:  Left-sided abdominal pain. Chest pain. Shortness of breath.  EXAM: CT ABDOMEN AND PELVIS WITHOUT CONTRAST  TECHNIQUE: Multidetector CT imaging of the abdomen and pelvis was performed following the standard protocol without IV contrast.  COMPARISON:  06/26/2013.  FINDINGS: Musculoskeletal: No aggressive osseous lesions. Bilateral hip osteoarthritis.  Lung Bases: Respiratory motion at the lung bases. Dependent atelectasis. Scarring in the RIGHT middle lobe anteriorly.  Liver: Hepatic cirrhosis. No focal mass lesion identified on noncontrast imaging. No intrahepatic biliary ductal dilation. Dome of the liver suboptimally visualized due to respiratory motion artifact. Unenhanced CT was performed per clinician order. Lack of IV contrast limits sensitivity and specificity, especially for evaluation of abdominal/pelvic solid viscera.  Spleen:  Splenomegaly with 15.5 cm splenic span.  Gallbladder: Distended with calcified stones layering dependently. No inflammatory changes of the gallbladder by noncontrast CT.  Common bile duct: No calcified stone in the common bile duct. No common bile duct dilation.  Pancreas:  Normal.  Adrenal glands:  Normal bilaterally.  Kidneys: Nonobstructing LEFT renal collecting system calculus in the inferior pole measuring 4 mm. LEFT ureter is normal. The RIGHT ureter is normal. No RIGHT renal calculi  are identified.  Stomach: Distal esophageal thickening is nonspecific but most commonly associated with gastroesophageal reflux. No inflammatory changes of stomach grossly.  Small bowel: Duodenum appears normal. No small bowel obstruction. No mesenteric adenopathy.  Colon:   Normal appendix.  Large stool burden.  Pelvic Genitourinary: Prostate calcifications. Urinary bladder appears within normal limits.  Vasculature: Atherosclerosis. Findings compatible with portal venous hypertension with perisplenic collaterals in the LEFT upper quadrant. There also appears to be recanalization of the periumbilical vein although this appears small.  Body Wall: Tiny fat containing periumbilical hernia.  IMPRESSION: 1. Hepatic cirrhosis. 2. Cholelithiasis. 3. Splenomegaly and portal venous hypertension  secondary to cirrhosis. Portal venous collaterals in the LEFT upper quadrant. 4. Nonobstructing LEFT renal calculi.   Electronically Signed   By: Dereck Ligas M.D.   On: 10/22/2013 17:19   Dg Cholangiogram Operative  10/23/2013   CLINICAL DATA:  Gallbladder disease  EXAM: INTRAOPERATIVE CHOLANGIOGRAM  TECHNIQUE: Cholangiographic images from the C-arm fluoroscopic device were submitted for interpretation post-operatively. Please see the procedural report for the amount of contrast and the fluoroscopy time utilized.  COMPARISON:  None.  FINDINGS: Contrast fills the biliary tree and duodenum with small irregular filling defects in the distal common bile duct.  IMPRESSION: Patent biliary tree.  Possible debris or stones in the distal common bile duct.   Electronically Signed   By: Maryclare Bean M.D.   On: 10/23/2013 12:53   US Abdomen Limited  10/22/2013   CLINICAL DATA:  Epigastric abdominal pain radiating into the flanks.  EXAM: US ABDOMEN LIMITED - RIGHT UPPER QUADRANT  COMPARISON:  CT 10/22/2013 and multiple prior CT exams. Ultrasound 03/21/2013.  FINDINGS: Gallbladder:  Gallbladder wall measures 7 mm. In the setting of  cirrhosis, this is a nonspecific finding. Sonographic Percell Miller sign is reported.  Common bile duct:  Diameter: 7 mm, within normal limits for age. No common duct stone is identified. The common bile duct size has increased compared 03/21/2013 ultrasound when it measured 4 mm.  Liver:  Hepatic cirrhosis.  No focal mass lesion is identified.  IMPRESSION: 1. Hepatic cirrhosis. 2. Gallbladder wall thickening is nonspecific in the setting of cirrhosis however in the presence of sonographic Murphy sign and gallstones on CT, these findings are suspicious for acute cholecystitis. No definite inflammatory changes were identified on CT today however CT is less sensitive than ultrasound for detection of acute cholecystitis. If the clinical scenario is equivocal, nuclear medicine HIDA scan could be considered to assess for cholecystitis. 3. Increasing size of the common bile duct compared to prior exam earlier this year. No common duct stone is identified.   Electronically Signed   By: Dereck Ligas M.D.   On: 10/22/2013 17:21   Dg Chest Portable 1 View  10/22/2013   CLINICAL DATA:  Shortness of breath.  EXAM: PORTABLE CHEST - 1 VIEW  COMPARISON:  05/04/2013.  FINDINGS: Trachea is midline. Heart size stable. Lungs are somewhat low in volume with mild bibasilar volume loss. No airspace consolidation or pleural fluid.  IMPRESSION: Low lung volumes with mild bibasilar volume loss.   Electronically Signed   By: Lorin Picket M.D.   On: 10/22/2013 13:22   Admission HPI:  Mr. Helm is a 58 year old man with history of renal stones s/p lithotripsy, DM, cirrhosis presenting with RUQ abdominal pain, nausea, vomiting x 1 day. The abdominal pain started this AM around 2 hours after eating some chili. He had bloating on the left side of his abdomen. He had emesis x 1 prior to arrival, nonbloody, nonbilious. He has been having intermittent LUQ/RUQ pain for the past 3 weeks. Endorses waking up in the middle of the night with  abdominal pain. Denies eating greasy foods. Denies fevers/chills. He has been taking care of his wife who recently had a knee replacement several weeks ago. Denies chest pain, shortness of breath, diarrhea, dysuria. He has urinary frequency and incomplete emptying.   Hospital Course by problem list: Principal Problem:   Acute cholecystitis Active Problems:   Cirrhosis   Type 2 diabetes mellitus   BPH (benign prostatic hyperplasia)   Thrombocytopenia, unspecified   #Acute cholecystitis  Mr. Antos presented with RUQ pain associated with nausea and vomiting.  Abdominal ultrasound showed gallbladder wall thickening with hepatic cirrhosis and no evidence of bile duct occlusion.  CT of his abdomen again demonstrated hepatic cirrhosis and cholelithiasis along with splenomegaly and portal venous hypertension.  He was afebrile with a normal WBC and no tachycardia. Lipase was slightly elevated. He was started on Unasyn for acute cholecystitis and general surgery was consulted.  They recommended laparoscopic cholecystectomy, which was performed on his second day of hospitalization after transfusion with 2 units of platelets.  He tolerated the surgery well with some post-operative pain that was controlled with opioids.  He was ambulating and eating a full diet the night of his surgery.  He was discharged home the day after surgery with Dilaudid 4 mg PO q4h PRN and Benadryl 25 mg q6h PRN for itching.  He was given a one week course of Augmentin, ending on 10/30/13 at the recommendation of general surgery.  #Cirrhosis Mr. Birman has been followed for his hepatic cirrhosis for several years.  Previous GI notes said the cause of his cirrhosis is unknown, but biopsy showed steatohepatitis, which may be related to his remote alcohol use or NASH.  On admission, his liver function tests were at his baseline (AST 54, alk phos 180, normal ALT, alk phos, and INR) and remained stable throughout his hospital stay.  Based  on his liver function, he is Child-Pugh class A.  #Thrombocytopenia Mr. 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  On admission, his platelet count was at his baseline of 48.  His medications were reviewed, and he did not have any prescriptions for drugs that commonly cause decreased platelets.  He was transfused with two units of platelets prior to his surgery and did fine without bleeding.  He had some bruising post-operatively periumbilically at a port site from the surgery, but his hemoglobin was stable and there was not concern for other bleeding.  #DM2  Mr. Zahner' Hgb A1C was 11.1 on admission. He says his blood sugar runs in the 200s and 300s usually at home. He takes 60 units Lantus BID and 30 units Novolog three times daily before meals at home. He was maintained on an insulin sliding scale with Lantus 15 units QHS during the hospitalization with decent blood glucose control, but he required significant sliding scale insulin as he began eating more.  He said that he was in the process of being referred to an endocrinologist through his PCP at the [place], and he was encouraged to keep this appointment as he would likely benefit from additional insulin or pharmacotherapy to reduce his insulin resistance.  #BPH  Mr. Boydstun reported urinary frequency and incomplete emptying. His urinalysis was negative, and although her had a L renal calculus on CT, it was non-obstructing.  Based on his symptoms, he was thought to have BPH.  He was started on Flomax 0.4 mg daily with mild improvement of his urinary symptoms.  Discharge Vitals:   BP 115/55  Pulse 60  Temp(Src) 97.8 F (36.6 C) (Oral)  Resp 20  Ht $R'5\' 7"'CA$  (1.702 m)  Wt 209 lb 14.4 oz (95.21 kg)  BMI 32.87 kg/m2  SpO2 96%  Discharge Labs:  Results for orders placed during the hospital encounter of 10/22/13 (from the past 24 hour(s))  GLUCOSE, CAPILLARY     Status:  Abnormal   Collection Time    10/23/13  8:09 PM      Result Value Ref Range   Glucose-Capillary 413 (*) 70 - 99 mg/dL   Comment 1 Notify RN     Comment 2 Documented in Chart    GLUCOSE, CAPILLARY     Status: Abnormal   Collection Time    10/23/13  8:11 PM      Result Value Ref Range   Glucose-Capillary 374 (*) 70 - 99 mg/dL   Comment 1 Notify RN     Comment 2 Documented in Chart    GLUCOSE, CAPILLARY     Status: Abnormal   Collection Time    10/24/13 12:19 AM      Result Value Ref Range   Glucose-Capillary 359 (*) 70 - 99 mg/dL   Comment 1 Notify RN    GLUCOSE, CAPILLARY     Status: Abnormal   Collection Time    10/24/13  4:15 AM      Result Value Ref Range   Glucose-Capillary 241 (*) 70 - 99 mg/dL   Comment 1 Notify RN    HEMOGLOBIN A1C     Status: Abnormal   Collection Time    10/24/13  5:25 AM      Result Value Ref Range   Hemoglobin A1C 11.3 (*) <5.7 %   Mean Plasma Glucose 278 (*) <117 mg/dL  COMPREHENSIVE METABOLIC PANEL     Status: Abnormal   Collection Time    10/24/13  5:25 AM      Result Value Ref Range   Sodium 135 (*) 137 - 147 mEq/L   Potassium 4.9  3.7 - 5.3 mEq/L   Chloride 101  96 - 112 mEq/L   CO2 21  19 - 32 mEq/L   Glucose, Bld 223 (*) 70 - 99 mg/dL   BUN 15  6 - 23 mg/dL   Creatinine, Ser 0.93  0.50 - 1.35 mg/dL   Calcium 9.5  8.4 - 10.5 mg/dL   Total Protein 6.9  6.0 - 8.3 g/dL   Albumin 2.9 (*) 3.5 - 5.2 g/dL   AST 63 (*) 0 - 37 U/L   ALT 38  0 - 53 U/L   Alkaline Phosphatase 146 (*) 39 - 117 U/L   Total Bilirubin 1.4 (*) 0.3 - 1.2 mg/dL   GFR calc non Af Amer >90  >90 mL/min   GFR calc Af Amer >90  >90 mL/min   Anion gap 13  5 - 15  CBC     Status: Abnormal   Collection Time    10/24/13  5:25 AM      Result Value Ref Range   WBC 6.9  4.0 - 10.5 K/uL   RBC 4.29  4.22 - 5.81 MIL/uL   Hemoglobin 12.7 (*) 13.0 - 17.0 g/dL   HCT 37.1 (*) 39.0 - 52.0 %   MCV 86.5  78.0 - 100.0 fL   MCH 29.6  26.0 - 34.0 pg   MCHC 34.2  30.0 - 36.0 g/dL    RDW 14.6  11.5 - 15.5 %   Platelets 67 (*) 150 - 400 K/uL  GLUCOSE, CAPILLARY     Status: Abnormal   Collection Time    10/24/13  7:38 AM      Result Value Ref Range   Glucose-Capillary 241 (*) 70 - 99 mg/dL  GLUCOSE, CAPILLARY     Status: Abnormal   Collection Time    10/24/13 12:26 PM      Result Value Ref Range   Glucose-Capillary 286 (*)  70 - 99 mg/dL    Signed: Arman Filter, MD 10/24/2013, 5:24 PM    Services Ordered on Discharge: None. Equipment Ordered on Discharge: None.

## 2013-10-24 NOTE — Progress Notes (Signed)
Agree Bruising around umbilicus Hgb stable Platelets 67  Ready for discharge.  [name]. Georgette Dover, MD, [facility]  General/ Trauma Surgery  10/24/2013 8:47 AM

## 2013-10-24 NOTE — Discharge Instructions (Addendum)
Thank you for allowing [place] to be involved in your healthcare while you were hospitalized at [facility].   Please note that there have been changes to your home medications.  --> PLEASE LOOK AT YOUR DISCHARGE MEDICATION LIST FOR DETAILS.   Please call your PCP if you have any questions or concerns, or any difficulty getting any of your medications.  Please return to the ER if you have worsening of your symptoms or new severe symptoms arise.  Pick up your antibiotics and take all of the pills that we prescribed.  You should complete this in one week.  We arranged a follow-up appointment in the [facility]), but you will also need to see you primary care doctor in September.  Please make sure your PCP refers you to an endocrinologist in the [place] system to further address your diabetes.  I wrote you a prescription for Dilaudid for pain relief, but your pain should improve soon.  If you continue to have pain after running out of medication or the Dilaudid is not working, call the [facility] and we can see you sooner.  LAPAROSCOPIC SURGERY: POST OP INSTRUCTIONS  1. DIET: Follow a light bland diet the first 24 hours after arrival home, such as soup, liquids, crackers, etc.  Be sure to include lots of fluids daily.  Avoid fast food or heavy meals as your are more likely to get nauseated.  Eat a low fat the next few days after surgery.   2. Take your usually prescribed home medications unless otherwise directed. 3. PAIN CONTROL: a. Pain is best controlled by a usual combination of three different methods TOGETHER: i. Ice/Heat ii. Over the counter pain medication iii. Prescription pain medication b. Most patients will experience some swelling and bruising around the incisions.  Ice packs or heating pads (30-60 minutes up to 6 times a day) will help. Use ice for the first few days to help decrease swelling and bruising, then switch to heat to help relax  tight/sore spots and speed recovery.  Some people prefer to use ice alone, heat alone, alternating between ice & heat.  Experiment to what works for you.  Swelling and bruising can take several weeks to resolve.   c. It is helpful to take an over-the-counter pain medication regularly for the first few weeks.  Choose one of the following that works best for you: i. Naproxen (Aleve, etc)  Two 220mg  tabs twice a day ii. Ibuprofen (Advil, etc) Three 200mg  tabs four times a day (every meal & bedtime) iii. Acetaminophen (Tylenol, etc) 500-650mg  four times a day (every meal & bedtime) d. A  prescription for pain medication (such as oxycodone, hydrocodone, etc) should be given to you upon discharge.  Take your pain medication as prescribed.  i. If you are having problems/concerns with the prescription medicine (does not control pain, nausea, vomiting, rash, itching, etc), please call us [phone] to see if we need to switch you to a different pain medicine that will work better for you and/or control your side effect better. ii. If you need a refill on your pain medication, please contact your pharmacy.  They will contact our office to request authorization. Prescriptions will not be filled after 5 pm or on week-ends. 4. Avoid getting constipated.  Between the surgery and the pain medications, it is common to experience some constipation.  Increasing fluid intake and taking a fiber supplement (such as Metamucil, Citrucel, FiberCon, MiraLax, etc) 1-2  times a day regularly will usually help prevent this problem from occurring.  A mild laxative (prune juice, Milk of Magnesia, MiraLax, etc) should be taken according to package directions if there are no bowel movements after 48 hours.   5. Watch out for diarrhea.  If you have many loose bowel movements, simplify your diet to bland foods & liquids for a few days.  Stop any stool softeners and decrease your fiber supplement.  Switching to mild anti-diarrheal  medications (Kayopectate, Pepto Bismol) can help.  If this worsens or does not improve, please call us. 6. Wash / shower every day.  You may shower over the dressings as they are waterproof.  Continue to shower over incision(s) after the dressing is off. 7. Remove your waterproof bandages 5 days after surgery.  You may leave the incision open to air.  You may replace a dressing/Band-Aid to cover the incision for comfort if you wish.  8. ACTIVITIES as tolerated:   a. You may resume regular (light) daily activities beginning the next day--such as daily self-care, walking, climbing stairs--gradually increasing activities as tolerated.  If you can walk 30 minutes without difficulty, it is safe to try more intense activity such as jogging, treadmill, bicycling, low-impact aerobics, swimming, etc. b. Save the most intensive and strenuous activity for last such as sit-ups, heavy lifting, contact sports, etc  Refrain from any heavy lifting or straining until you are off narcotics for pain control.   c. DO NOT PUSH THROUGH PAIN.  Let pain be your guide: If it hurts to do something, don't do it.  Pain is your body warning you to avoid that activity for another week until the pain goes down. d. You may drive when you are no longer taking prescription pain medication, you can comfortably wear a seatbelt, and you can safely maneuver your car and apply brakes. e. Dennis Bast may have sexual intercourse when it is comfortable.  9. FOLLOW UP in our office a. Please call [facility] at [phone] to set up an appointment to see your surgeon in the office for a follow-up appointment approximately 2-3 weeks after your surgery. b. Make sure that you call for this appointment the day you arrive home to insure a convenient appointment time. 10. IF YOU HAVE DISABILITY OR FAMILY LEAVE FORMS, BRING THEM TO THE OFFICE FOR PROCESSING.  DO NOT GIVE THEM TO YOUR DOCTOR.   WHEN TO CALL us [phone]: 1. Poor pain control 2. Reactions /  problems with new medications (rash/itching, nausea, etc)  3. Fever over 101.5 F (38.5 C) 4. Inability to urinate 5. Nausea and/or vomiting 6. Worsening swelling or bruising 7. Continued bleeding from incision. 8. Increased pain, redness, or drainage from the incision   The clinic staff is available to answer your questions during regular business hours (8:30am-5pm).  Please dont hesitate to call and ask to speak to one of our nurses for clinical concerns.   If you have a medical emergency, go to the nearest emergency room or call 911.  A surgeon from [facility] is always on call at the [facility], [place], [place], [place], [place]  [id number] ? MAIN: [phone] ? TOLL FREE: [phone] ?  FAX [phone] [email or web address]

## 2013-10-24 NOTE — Progress Notes (Signed)
Doing well postoperatively. Reports pain controlled with dilaudid prn. Reports itching with Percocet. Had subjective fevers immediately post op, which have resolved now. Tolerating carb modified diet without nausea/vomiting. Voiding freely. +flatus. Denies BM. Ambulating.  Filed Vitals:   10/23/13 1451 10/23/13 1454 10/23/13 2203 10/24/13 0213  BP: 137/66  126/56 122/63  Pulse:  65 77 74  Temp:  97 F (36.1 C) 97.9 F (36.6 C) 97.7 F (36.5 C)  TempSrc:   Oral Oral  Resp:  15 18 22   Height:      Weight:      SpO2:  94% 96% 97%  General: NAD Respiratory: breathing comfortably on RA Abdomen: soft, appropriately tender to palpation, dressings in place with minimal serosanguinous strikethrough Extr: no edema Neuro: no gross deficits  Encouraged IS use Continue to monitor  Jacques Earthly, PGY1 Internal Medicine Teaching Service

## 2013-10-24 NOTE — Progress Notes (Signed)
Subjective:    Currently, the patient reports continued abdominal pain and says that Percocet has been causing him to itch due to the Tylenol.  He was hungry last night and ate some meatloaf for dinner.  He says he had trouble sleeping last night and had one episode of vomiting during the night, but has not had any nausea or vomiting since.  Denies bowel movement or passing gas since surgery.  Denies hematemesis, chest tightness, shortness of breath, hematochezia.  Interval Events: -Lap cholecystectomy yesterday. -VS stable. -Hgb slightly down to 12.7 from 13.7 yesterday.   Objective:    Vital Signs:   Temp:  [97 F (36.1 C)-97.9 F (36.6 C)] 97.8 F (36.6 C) (08/19 0555) Pulse Rate:  [60-94] 60 (08/19 0555) Resp:  [11-23] 20 (08/19 0555) BP: (92-147)/(39-77) 115/55 mmHg (08/19 0555) SpO2:  [92 %-99 %] 96 % (08/19 0555)    24-hour weight change: Weight change:   Intake/Output:   Intake/Output Summary (Last 24 hours) at 10/24/13 0852 Last data filed at 10/24/13 0704  Gross per 24 hour  Intake 2901.34 ml  Output    875 ml  Net 2026.34 ml      Physical Exam: General: Vital signs reviewed and noted. Well-developed, well-nourished, in no acute distress; alert, appropriate and cooperative throughout examination.  Lungs:  Normal respiratory effort. Clear to auscultation BL without crackles or wheezes.  Heart: RRR. S1 and S2 normal without gallop, murmur, or rubs.  Abdomen:  Moderate tenderness on R side of abdomen, mild distension, periumbilical ecchymosis. BS normoactive. No masses or organomegaly.  Extremities: No pretibial edema.    Labs:  Basic Metabolic Panel:  Recent Labs Lab 10/22/13 1245 10/22/13 1305 10/23/13 0001 10/24/13 0525  NA 139 138 141 135*  K 4.2 4.0 4.0 4.9  CL 103 102 106 101  CO2 23  --  26 21  GLUCOSE 352* 377* 155* 223*  BUN $Re'8 7 8 15  'Dez$ CREATININE 0.84 0.90 0.81 0.93  CALCIUM 9.1  --  8.5 9.5    Liver Function Tests:  Recent  Labs Lab 10/22/13 1245 10/23/13 0001 10/24/13 0525  AST 54* 55* 63*  ALT 41 40 38  ALKPHOS 180* 171* 146*  BILITOT 0.8 1.4* 1.4*  PROT 7.1 6.8 6.9  ALBUMIN 3.0* 2.9* 2.9*    Recent Labs Lab 10/22/13 1245  LIPASE 64*   CBC:  Recent Labs Lab 10/22/13 1245 10/22/13 1305 10/23/13 0001 10/24/13 0525  WBC 3.7*  --  3.7* 6.9  NEUTROABS 2.3  --   --   --   HGB 14.6 15.3 13.7 12.7*  HCT 42.0 45.0 39.6 37.1*  MCV 85.7  --  85.0 86.5  PLT 48*  --  44* 67*   CBG:  Recent Labs Lab 10/23/13 2009 10/23/13 2011 10/24/13 0019 10/24/13 0415 10/24/13 0738  GLUCAP 413* 374* 359* 241* 241*    Microbiology: Results for orders placed during the hospital encounter of 10/22/13  URINE CULTURE     Status: None   Collection Time    10/22/13  9:23 PM      Result Value Ref Range Status   Specimen Description URINE, RANDOM   Final   Special Requests NONE   Final   Culture  Setup Time     Final   Value: 10/22/2013 22:12     Performed at [place]     Final   Value: NO GROWTH     Performed at [facility]   Culture     Final   Value: NO GROWTH     Performed at [facility]   Report Status 10/24/2013 FINAL   Final  SURGICAL PCR SCREEN     Status: None   Collection Time    10/23/13  3:10 AM      Result Value Ref Range Status   MRSA, PCR NEGATIVE  NEGATIVE Final   Staphylococcus aureus NEGATIVE  NEGATIVE Final   Comment:            The Xpert SA Assay (FDA     approved for NASAL specimens     in patients over 26 years of age),     is one component of     a comprehensive surveillance     program.  Test performance has     been validated by [employer] for patients greater     than or equal to 51 year old.     It is not intended     to diagnose infection nor to     guide or monitor treatment.    Coagulation Studies:  Recent Labs  10/22/13 2029  LABPROT 14.7  INR 1.15    Imaging: Ct Abdomen Pelvis Wo  Contrast  10/22/2013   CLINICAL DATA:  Left-sided abdominal pain. Chest pain. Shortness of breath.  EXAM: CT ABDOMEN AND PELVIS WITHOUT CONTRAST  TECHNIQUE: Multidetector CT imaging of the abdomen and pelvis was performed following the standard protocol without IV contrast.  COMPARISON:  06/26/2013.  FINDINGS: Musculoskeletal: No aggressive osseous lesions. Bilateral hip osteoarthritis.  Lung Bases: Respiratory motion at the lung bases. Dependent atelectasis. Scarring in the RIGHT middle lobe anteriorly.  Liver: Hepatic cirrhosis. No focal mass lesion identified on noncontrast imaging. No intrahepatic biliary ductal dilation. Dome of the liver suboptimally visualized due to respiratory motion artifact. Unenhanced CT was performed per clinician order. Lack of IV contrast limits sensitivity and specificity, especially for evaluation of abdominal/pelvic solid viscera.  Spleen:  Splenomegaly with 15.5 cm splenic span.  Gallbladder: Distended with calcified stones layering dependently. No inflammatory changes of the gallbladder by noncontrast CT.  Common bile duct: No calcified stone in the common bile duct. No common bile duct dilation.  Pancreas:  Normal.  Adrenal glands:  Normal bilaterally.  Kidneys: Nonobstructing LEFT renal collecting system calculus in the inferior pole measuring 4 mm. LEFT ureter is normal. The RIGHT ureter is normal. No RIGHT renal calculi are identified.  Stomach: Distal esophageal thickening is nonspecific but most commonly associated with gastroesophageal reflux. No inflammatory changes of stomach grossly.  Small bowel: Duodenum appears normal. No small bowel obstruction. No mesenteric adenopathy.  Colon:   Normal appendix.  Large stool burden.  Pelvic Genitourinary: Prostate calcifications. Urinary bladder appears within normal limits.  Vasculature: Atherosclerosis. Findings compatible with portal venous hypertension with perisplenic collaterals in the LEFT upper quadrant. There also  appears to be recanalization of the periumbilical vein although this appears small.  Body Wall: Tiny fat containing periumbilical hernia.  IMPRESSION: 1. Hepatic cirrhosis. 2. Cholelithiasis. 3. 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 Portal venous collaterals in the LEFT upper quadrant. 4. Nonobstructing LEFT renal calculi.   Electronically Signed   By: Andreas Newport M.D.   On: 10/22/2013 17:19   Dg Cholangiogram Operative  10/23/2013   CLINICAL DATA:  Gallbladder disease  EXAM: INTRAOPERATIVE CHOLANGIOGRAM  TECHNIQUE: Cholangiographic images from the C-arm fluoroscopic device were submitted for interpretation post-operatively. Please see  the procedural report for the amount of contrast and the fluoroscopy time utilized.  COMPARISON:  None.  FINDINGS: Contrast fills the biliary tree and duodenum with small irregular filling defects in the distal common bile duct.  IMPRESSION: Patent biliary tree.  Possible debris or stones in the distal common bile duct.   Electronically Signed   By: Maryclare Bean M.D.   On: 10/23/2013 12:53   US Abdomen Limited  10/22/2013   CLINICAL DATA:  Epigastric abdominal pain radiating into the flanks.  EXAM: US ABDOMEN LIMITED - RIGHT UPPER QUADRANT  COMPARISON:  CT 10/22/2013 and multiple prior CT exams. Ultrasound 03/21/2013.  FINDINGS: Gallbladder:  Gallbladder wall measures 7 mm. In the setting of cirrhosis, this is a nonspecific finding. Sonographic Percell Miller sign is reported.  Common bile duct:  Diameter: 7 mm, within normal limits for age. No common duct stone is identified. The common bile duct size has increased compared 03/21/2013 ultrasound when it measured 4 mm.  Liver:  Hepatic cirrhosis.  No focal mass lesion is identified.  IMPRESSION: 1. Hepatic cirrhosis. 2. Gallbladder wall thickening is nonspecific in the setting of cirrhosis however in the presence of sonographic Murphy sign and gallstones on CT, these findings are suspicious for acute  cholecystitis. No definite inflammatory changes were identified on CT today however CT is less sensitive than ultrasound for detection of acute cholecystitis. If the clinical scenario is equivocal, nuclear medicine HIDA scan could be considered to assess for cholecystitis. 3. Increasing size of the common bile duct compared to prior exam earlier this year. No common duct stone is identified.   Electronically Signed   By: Dereck Ligas M.D.   On: 10/22/2013 17:21   Dg Chest Portable 1 View  10/22/2013   CLINICAL DATA:  Shortness of breath.  EXAM: PORTABLE CHEST - 1 VIEW  COMPARISON:  05/04/2013.  FINDINGS: Trachea is midline. Heart size stable. Lungs are somewhat low in volume with mild bibasilar volume loss. No airspace consolidation or pleural fluid.  IMPRESSION: Low lung volumes with mild bibasilar volume loss.   Electronically Signed   By: Lorin Picket M.D.   On: 10/22/2013 13:22     Medications:    Infusions: . sodium chloride 1,000 mL (10/24/13 0037)  . lactated ringers      Scheduled Medications: . sodium chloride   Intravenous Once  . diphenhydrAMINE  25 mg Oral Once  . insulin aspart  0-15 Units Subcutaneous 6 times per day  . insulin glargine  15 Units Subcutaneous QHS  . pneumococcal 23 valent vaccine  0.5 mL Intramuscular Tomorrow-1000  . tamsulosin  0.4 mg Oral QPC supper    PRN Medications: HYDROmorphone (DILAUDID) injection, ondansetron (ZOFRAN) IV, ondansetron (ZOFRAN) IV, ondansetron, ondansetron, oxyCODONE   Assessment/ Plan:    Active Problems:   Cholecystitis   Acute cholecystitis   #Acute cholecystitis RUQ pain associated with nausea and vomiting. US showed gallbladder wall thickening and sonographic Murphy's sign.  Gallstones on CT.  Patient afebrile, normal WBC without tachycardia.  LFTs at baseline with normal T bili and no signs of obstruction. Lipase of 64.  Gen surgery consulted and performed laproscopic cholecystectomy yesterday.  Doing well post-op,  ambulating and eating a full diet.  Denies bowel movement so far, but good bowel sounds.  Gen surgery okay with discharge today.  Recommend one week course of antibiotics (Augmentin or Cipro).  Platelet count stable.  Pain stable, but itching with Percocet, so will switch to oxycodone. -Appreciate gen surg help with  care. -Probable discharge today. -Carb modified diet. -Switch from Percocet to oxycodone 5-10 mg PO q4h PRN. -Stop dilaudid 0.5-1 mg q2h prn. -Switch from Unasyn to Augmentin PO for one week (ending 10/30/13). -Continue Zofran 4 mg q6h prn   #Cirrhosis Previous GI notes say that the cause of his cirrhosis is unknown, but likely steatohepatitis after results of biopsy.  Remote history of alcohol abuse, but no drinking in 20 years.  AST 54 (baseline 50) and alk phos 180 (baseline 170) and ALT and t. bili wnl on admission. -Continue to monitor.  #Thrombocytopenia Cause unclear, patient reports negative work-up previously.  Consistently ~50 for several years.  Potentially related to cirrhosis.  Likely cause of ecchymosis post-surgery.  Hgb stable. -Continue to monitor for bleeding with CBC.  #DM2 Hgb A1C 11.1.  Pt states BG runs in the 200s and 300s usually. Sometimes up to 500. Takes 60 units Lantus BID at home and 30 units Novolog three times daily before meals.  Needs endocrinology follow-up, but will need to be scheduled through the [place]. Plans to see new PCP at the [facility] on September 24th. -SSI moderate. -CBG AC and HS. -Increase Lantus tonight if he stays. -Will discharge on home Lantus regimen, but encourage to follow up with endocrinologist through the [place].  #BPH Reports urinary frequency and incomplete emptying.  U/A negative and non-obstruction L renal calculus on CT scan. -Continue Flomax 0.4mg  daily   #Renal stones s/p lithotripsy Nonobstructing left renal calculus on CT scan. 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 Calcium oxalate crystals in UA. N/V improved  after lap chole. -Continue to monitor .   DVT PPX - SCD's while in bed  CODE STATUS - Full code.  CONSULTS PLACED - Gen surg.  DISPO - Likely discharge today.  The patient does have a current PCP Chriss Czar, MD) and does need an [facility] hospital follow-up appointment after discharge.    Is the [facility] hospital follow-up appointment a one-time only appointment? Yes.  Does the patient have transportation limitations that hinder transportation to clinic appointments? no   SERVICE NEEDED AT [place]         Y = Yes, Blank = No PT:   OT:   RN:   Equipment:   Other:      Length of Stay: 2 day(s)   Signed: Arman Filter, MD  PGY-1, Internal Medicine Resident Pager: [phone] (7AM-5PM) 10/24/2013, 8:52 AM

## 2013-10-24 NOTE — Progress Notes (Signed)
Patient ID: Bradley Green, male   DOB: Aug 18, 1955, 58 y.o.   MRN: [id number]     CENTRAL  SURGERY      [address] [place], [place] [address]    Phone: [phone] FAX: [phone]     Subjective: Tolerating a diet, voiding, passing flatus.  C/o pain.  VSS.  Afebrile.  Labs are stable.   Objective:  Vital signs:  Filed Vitals:   10/23/13 1454 10/23/13 2203 10/24/13 0213 10/24/13 0555  BP:  126/56 122/63 115/55  Pulse: 65 77 74 60  Temp: 97 F (36.1 C) 97.9 F (36.6 C) 97.7 F (36.5 C) 97.8 F (36.6 C)  TempSrc:  Oral Oral Oral  Resp: $Remo'15 18 22 20  'ebeHk$ Height:      Weight:      SpO2: 94% 96% 97% 96%       Intake/Output   Yesterday:  08/18 0701 - 08/19 0700 In: 2834.7 [P.O.:600; I.V.:1661.7; Blood:573] Out: 938 [Urine:875] This shift:  Total I/O In: 66.7 [I.V.:66.7] Out: -    Physical Exam: General: Pt awake/alert/oriented x4 in no acute distress Abdomen: Soft.  Nondistended.  Appropriately tender.  Umbilical incision with intact steri strips, small surrounding area or ecchymosis.  Remainder of incisions are c/d/i and without any bruising.  No evidence of peritonitis.  No incarcerated hernias.   Problem List:   Active Problems:   Cholecystitis   Acute cholecystitis    Results:   Labs: Results for orders placed during the hospital encounter of 10/22/13 (from the past 48 hour(s))  CBG MONITORING, ED     Status: Abnormal   Collection Time    10/22/13 12:36 PM      Result Value Ref Range   Glucose-Capillary 344 (*) 70 - 99 mg/dL   Comment 1 Documented in Chart     Comment 2 Notify RN    CBC WITH DIFFERENTIAL     Status: Abnormal   Collection Time    10/22/13 12:45 PM      Result Value Ref Range   WBC 3.7 (*) 4.0 - 10.5 K/uL   RBC 4.90  4.22 - 5.81 MIL/uL   Hemoglobin 14.6  13.0 - 17.0 g/dL   HCT 42.0  39.0 - 52.0 %   MCV 85.7  78.0 - 100.0 fL   MCH 29.8  26.0 - 34.0 pg   MCHC 34.8  30.0 - 36.0 g/dL    RDW 14.6  11.5 - 15.5 %   Platelets 48 (*) 150 - 400 K/uL   Comment: PLATELET COUNT CONFIRMED BY SMEAR   Neutrophils Relative % 63  43 - 77 %   Neutro Abs 2.3  1.7 - 7.7 K/uL   Lymphocytes Relative 21  12 - 46 %   Lymphs Abs 0.8  0.7 - 4.0 K/uL   Monocytes Relative 13 (*) 3 - 12 %   Monocytes Absolute 0.5  0.1 - 1.0 K/uL   Eosinophils Relative 3  0 - 5 %   Eosinophils Absolute 0.1  0.0 - 0.7 K/uL   Basophils Relative 0  0 - 1 %   Basophils Absolute 0.0  0.0 - 0.1 K/uL  COMPREHENSIVE METABOLIC PANEL     Status: Abnormal   Collection Time    10/22/13 12:45 PM      Result Value Ref Range   Sodium 139  137 - 147 mEq/L   Potassium 4.2  3.7 - 5.3 mEq/L   Chloride 103  96 - 112 mEq/L  CO2 23  19 - 32 mEq/L   Glucose, Bld 352 (*) 70 - 99 mg/dL   BUN 8  6 - 23 mg/dL   Creatinine, Ser 0.84  0.50 - 1.35 mg/dL   Calcium 9.1  8.4 - 10.5 mg/dL   Total Protein 7.1  6.0 - 8.3 g/dL   Albumin 3.0 (*) 3.5 - 5.2 g/dL   AST 54 (*) 0 - 37 U/L   ALT 41  0 - 53 U/L   Alkaline Phosphatase 180 (*) 39 - 117 U/L   Total Bilirubin 0.8  0.3 - 1.2 mg/dL   GFR calc non Af Amer >90  >90 mL/min   GFR calc Af Amer >90  >90 mL/min   Comment: (NOTE)     The eGFR has been calculated using the CKD EPI equation.     This calculation has not been validated in all clinical situations.     eGFR's persistently <90 mL/min signify possible Chronic Kidney     Disease.   Anion gap 13  5 - 15  LIPASE, BLOOD     Status: Abnormal   Collection Time    10/22/13 12:45 PM      Result Value Ref Range   Lipase 64 (*) 11 - 59 U/L  HEMOGLOBIN A1C     Status: Abnormal   Collection Time    10/22/13 12:45 PM      Result Value Ref Range   Hemoglobin A1C 11.1 (*) <5.7 %   Comment: (NOTE)                                                                               According to the ADA Clinical Practice Recommendations for 2011, when     HbA1c is used as a screening test:      >=6.5%   Diagnostic of Diabetes Mellitus                (if abnormal result is confirmed)     5.7-6.4%   Increased risk of developing Diabetes Mellitus     References:Diagnosis and Classification of Diabetes Mellitus,Diabetes     QVZD,6387,56(EPPIR 1):S62-S69 and Standards of Medical Care in             Diabetes - 2011,Diabetes Care,2011,34 (Suppl 1):S11-S61.   Mean Plasma Glucose 272 (*) <117 mg/dL   Comment: Performed at [facility], ED     Status: Abnormal   Collection Time    10/22/13  1:05 PM      Result Value Ref Range   Sodium 138  137 - 147 mEq/L   Potassium 4.0  3.7 - 5.3 mEq/L   Chloride 102  96 - 112 mEq/L   BUN 7  6 - 23 mg/dL   Creatinine, Ser 0.90  0.50 - 1.35 mg/dL   Glucose, Bld 377 (*) 70 - 99 mg/dL   Calcium, Ion 1.17  1.12 - 1.23 mmol/L   TCO2 21  0 - 100 mmol/L   Hemoglobin 15.3  13.0 - 17.0 g/dL   HCT 45.0  39.0 - 52.0 %  URINALYSIS, ROUTINE W REFLEX MICROSCOPIC     Status: Abnormal   Collection Time  10/22/13  2:00 PM      Result Value Ref Range   Color, Urine YELLOW  YELLOW   APPearance CLEAR  CLEAR   Specific Gravity, Urine 1.030  1.005 - 1.030   pH 5.5  5.0 - 8.0   Glucose, UA >1000 (*) NEGATIVE mg/dL   Hgb urine dipstick NEGATIVE  NEGATIVE   Bilirubin Urine NEGATIVE  NEGATIVE   Ketones, ur NEGATIVE  NEGATIVE mg/dL   Protein, ur NEGATIVE  NEGATIVE mg/dL   Urobilinogen, UA 0.2  0.0 - 1.0 mg/dL   Nitrite NEGATIVE  NEGATIVE   Leukocytes, UA NEGATIVE  NEGATIVE  URINE MICROSCOPIC-ADD ON     Status: Abnormal   Collection Time    10/22/13  2:00 PM      Result Value Ref Range   Squamous Epithelial / LPF RARE  RARE   WBC, UA 0-2  <3 WBC/hpf   RBC / HPF 0-2  <3 RBC/hpf   Crystals CA OXALATE CRYSTALS (*) NEGATIVE  PROTIME-INR     Status: None   Collection Time    10/22/13  8:29 PM      Result Value Ref Range   Prothrombin Time 14.7  11.6 - 15.2 seconds   INR 1.15  0.00 - 1.49  URINE CULTURE     Status: None   Collection Time    10/22/13  9:23 PM      Result Value Ref Range    Specimen Description URINE, RANDOM     Special Requests NONE     Culture  Setup Time       Value: 10/22/2013 22:12     Performed at [facility]       Value: NO GROWTH     Performed at [facility]   Culture       Value: NO GROWTH     Performed at [facility]   Report Status 10/24/2013 FINAL    URINE RAPID DRUG SCREEN (HOSP PERFORMED)     Status: None   Collection Time    10/22/13  9:40 PM      Result Value Ref Range   Opiates NONE DETECTED  NONE DETECTED   Cocaine NONE DETECTED  NONE DETECTED   Benzodiazepines NONE DETECTED  NONE DETECTED   Amphetamines NONE DETECTED  NONE DETECTED   Tetrahydrocannabinol NONE DETECTED  NONE DETECTED   Barbiturates NONE DETECTED  NONE DETECTED   Comment:            DRUG SCREEN FOR MEDICAL PURPOSES     ONLY.  IF CONFIRMATION IS NEEDED     FOR ANY PURPOSE, NOTIFY LAB     WITHIN 5 DAYS.                LOWEST DETECTABLE LIMITS     FOR URINE DRUG SCREEN     Drug Class       Cutoff (ng/mL)     Amphetamine      1000     Barbiturate      200     Benzodiazepine   585     Tricyclics       277     Opiates          300     Cocaine          300     THC              50  TYPE AND SCREEN  Status: None   Collection Time    10/22/13 11:56 PM      Result Value Ref Range   ABO/RH(D) O NEG     Antibody Screen NEG     Sample Expiration 10/25/2013    ABO/RH     Status: None   Collection Time    10/22/13 11:56 PM      Result Value Ref Range   ABO/RH(D) O NEG    COMPREHENSIVE METABOLIC PANEL     Status: Abnormal   Collection Time    10/23/13 12:01 AM      Result Value Ref Range   Sodium 141  137 - 147 mEq/L   Potassium 4.0  3.7 - 5.3 mEq/L   Chloride 106  96 - 112 mEq/L   CO2 26  19 - 32 mEq/L   Glucose, Bld 155 (*) 70 - 99 mg/dL   BUN 8  6 - 23 mg/dL   Creatinine, Ser 0.81  0.50 - 1.35 mg/dL   Calcium 8.5  8.4 - 10.5 mg/dL   Total Protein 6.8  6.0 - 8.3 g/dL   Albumin 2.9 (*) 3.5 - 5.2 g/dL   AST  55 (*) 0 - 37 U/L   ALT 40  0 - 53 U/L   Alkaline Phosphatase 171 (*) 39 - 117 U/L   Total Bilirubin 1.4 (*) 0.3 - 1.2 mg/dL   GFR calc non Af Amer >90  >90 mL/min   GFR calc Af Amer >90  >90 mL/min   Comment: (NOTE)     The eGFR has been calculated using the CKD EPI equation.     This calculation has not been validated in all clinical situations.     eGFR's persistently <90 mL/min signify possible Chronic Kidney     Disease.   Anion gap 9  5 - 15  CBC     Status: Abnormal   Collection Time    10/23/13 12:01 AM      Result Value Ref Range   WBC 3.7 (*) 4.0 - 10.5 K/uL   RBC 4.66  4.22 - 5.81 MIL/uL   Hemoglobin 13.7  13.0 - 17.0 g/dL   HCT 39.6  39.0 - 52.0 %   MCV 85.0  78.0 - 100.0 fL   MCH 29.4  26.0 - 34.0 pg   MCHC 34.6  30.0 - 36.0 g/dL   RDW 14.5  11.5 - 15.5 %   Platelets 44 (*) 150 - 400 K/uL   Comment: CONSISTENT WITH PREVIOUS RESULT  HIV ANTIBODY (ROUTINE TESTING)     Status: None   Collection Time    10/23/13 12:01 AM      Result Value Ref Range   HIV 1&2 Ab, 4th Generation NONREACTIVE  NONREACTIVE   Comment: (NOTE)     A NONREACTIVE HIV Ag/Ab result does not exclude HIV infection since     the time frame for seroconversion is variable. If acute HIV infection     is suspected, a HIV-1 RNA Qualitative TMA test is recommended.     HIV-1/2 Antibody Diff         Not indicated.     HIV-1 RNA, Qual TMA           Not indicated.     PLEASE NOTE: This information has been disclosed to you from records     whose confidentiality may be protected by state law. If your state     requires such protection, then the state law prohibits you from  making     any further disclosure of the information without the specific written     consent of the person to whom it pertains, or as otherwise permitted     by law. A general authorization for the release of medical or other     information is NOT sufficient for this purpose.     The performance of this assay has not been clinically  validated in     patients less than 62 years old.     Performed at [place]     Status: None   Collection Time    10/23/13  3:10 AM      Result Value Ref Range   MRSA, PCR NEGATIVE  NEGATIVE   Staphylococcus aureus NEGATIVE  NEGATIVE   Comment:            The Xpert SA Assay (FDA     approved for NASAL specimens     in patients over 91 years of age),     is one component of     a comprehensive surveillance     program.  Test performance has     been validated by [employer] for patients greater     than or equal to 6 year old.     It is not intended     to diagnose infection nor to     guide or monitor treatment.  GLUCOSE, CAPILLARY     Status: Abnormal   Collection Time    10/23/13  5:35 AM      Result Value Ref Range   Glucose-Capillary 177 (*) 70 - 99 mg/dL  GLUCOSE, CAPILLARY     Status: Abnormal   Collection Time    10/23/13  7:45 AM      Result Value Ref Range   Glucose-Capillary 166 (*) 70 - 99 mg/dL  PREPARE PLATELET PHERESIS     Status: None   Collection Time    10/23/13 10:00 AM      Result Value Ref Range   Unit Number [id number]     Blood Component Type PLTPHER LR1     Unit division 00     Status of Unit ISSUED     Transfusion Status OK TO TRANSFUSE     Unit Number [id number]     Blood Component Type PLTPHER LR1     Unit division 00     Status of Unit ISSUED     Transfusion Status OK TO TRANSFUSE    GLUCOSE, CAPILLARY     Status: Abnormal   Collection Time    10/23/13 11:03 AM      Result Value Ref Range   Glucose-Capillary 184 (*) 70 - 99 mg/dL  GLUCOSE, CAPILLARY     Status: Abnormal   Collection Time    10/23/13  1:28 PM      Result Value Ref Range   Glucose-Capillary 198 (*) 70 - 99 mg/dL   Comment 1 Notify RN    GLUCOSE, CAPILLARY     Status: Abnormal   Collection Time    10/23/13  3:53 PM      Result Value Ref Range   Glucose-Capillary 193 (*) 70 - 99 mg/dL  GLUCOSE, CAPILLARY     Status:  Abnormal   Collection Time    10/23/13  8:09 PM      Result Value Ref Range   Glucose-Capillary 413 (*) 70 - 99 mg/dL   Comment 1  Notify RN     Comment 2 Documented in Chart    GLUCOSE, CAPILLARY     Status: Abnormal   Collection Time    10/23/13  8:11 PM      Result Value Ref Range   Glucose-Capillary 374 (*) 70 - 99 mg/dL   Comment 1 Notify RN     Comment 2 Documented in Chart    GLUCOSE, CAPILLARY     Status: Abnormal   Collection Time    10/24/13 12:19 AM      Result Value Ref Range   Glucose-Capillary 359 (*) 70 - 99 mg/dL   Comment 1 Notify RN    GLUCOSE, CAPILLARY     Status: Abnormal   Collection Time    10/24/13  4:15 AM      Result Value Ref Range   Glucose-Capillary 241 (*) 70 - 99 mg/dL   Comment 1 Notify RN    COMPREHENSIVE METABOLIC PANEL     Status: Abnormal   Collection Time    10/24/13  5:25 AM      Result Value Ref Range   Sodium 135 (*) 137 - 147 mEq/L   Potassium 4.9  3.7 - 5.3 mEq/L   Comment: DELTA CHECK NOTED   Chloride 101  96 - 112 mEq/L   CO2 21  19 - 32 mEq/L   Glucose, Bld 223 (*) 70 - 99 mg/dL   BUN 15  6 - 23 mg/dL   Creatinine, Ser 0.93  0.50 - 1.35 mg/dL   Calcium 9.5  8.4 - 10.5 mg/dL   Total Protein 6.9  6.0 - 8.3 g/dL   Albumin 2.9 (*) 3.5 - 5.2 g/dL   AST 63 (*) 0 - 37 U/L   ALT 38  0 - 53 U/L   Alkaline Phosphatase 146 (*) 39 - 117 U/L   Total Bilirubin 1.4 (*) 0.3 - 1.2 mg/dL   GFR calc non Af Amer >90  >90 mL/min   GFR calc Af Amer >90  >90 mL/min   Comment: (NOTE)     The eGFR has been calculated using the CKD EPI equation.     This calculation has not been validated in all clinical situations.     eGFR's persistently <90 mL/min signify possible Chronic Kidney     Disease.   Anion gap 13  5 - 15  CBC     Status: Abnormal   Collection Time    10/24/13  5:25 AM      Result Value Ref Range   WBC 6.9  4.0 - 10.5 K/uL   RBC 4.29  4.22 - 5.81 MIL/uL   Hemoglobin 12.7 (*) 13.0 - 17.0 g/dL   HCT 37.1 (*) 39.0 - 52.0 %   MCV  86.5  78.0 - 100.0 fL   MCH 29.6  26.0 - 34.0 pg   MCHC 34.2  30.0 - 36.0 g/dL   RDW 14.6  11.5 - 15.5 %   Platelets 67 (*) 150 - 400 K/uL   Comment: DELTA CHECK NOTED     POST TRANSFUSION SPECIMEN  GLUCOSE, CAPILLARY     Status: Abnormal   Collection Time    10/24/13  7:38 AM      Result Value Ref Range   Glucose-Capillary 241 (*) 70 - 99 mg/dL    Imaging / Studies: Ct Abdomen Pelvis Wo Contrast  10/22/2013   CLINICAL DATA:  Left-sided abdominal pain. Chest pain. Shortness of breath.  EXAM: CT ABDOMEN AND PELVIS WITHOUT CONTRAST  TECHNIQUE: Multidetector CT imaging of the abdomen and pelvis was performed following the standard protocol without IV contrast.  COMPARISON:  06/26/2013.  FINDINGS: Musculoskeletal: No aggressive osseous lesions. Bilateral hip osteoarthritis.  Lung Bases: Respiratory motion at the lung bases. Dependent atelectasis. Scarring in the RIGHT middle lobe anteriorly.  Liver: Hepatic cirrhosis. No focal mass lesion identified on noncontrast imaging. No intrahepatic biliary ductal dilation. Dome of the liver suboptimally visualized due to respiratory motion artifact. Unenhanced CT was performed per clinician order. Lack of IV contrast limits sensitivity and specificity, especially for evaluation of abdominal/pelvic solid viscera.  Spleen:  Splenomegaly with 15.5 cm splenic span.  Gallbladder: Distended with calcified stones layering dependently. No inflammatory changes of the gallbladder by noncontrast CT.  Common bile duct: No calcified stone in the common bile duct. No common bile duct dilation.  Pancreas:  Normal.  Adrenal glands:  Normal bilaterally.  Kidneys: Nonobstructing LEFT renal collecting system calculus in the inferior pole measuring 4 mm. LEFT ureter is normal. The RIGHT ureter is normal. No RIGHT renal calculi are identified.  Stomach: Distal esophageal thickening is nonspecific but most commonly associated with gastroesophageal reflux. No inflammatory changes of  stomach grossly.  Small bowel: Duodenum appears normal. No small bowel obstruction. No mesenteric adenopathy.  Colon:   Normal appendix.  Large stool burden.  Pelvic Genitourinary: Prostate calcifications. Urinary bladder appears within normal limits.  Vasculature: Atherosclerosis. Findings compatible with portal venous hypertension with perisplenic collaterals in the LEFT upper quadrant. There also appears to be recanalization of the periumbilical vein although this appears small.  Body Wall: Tiny fat containing periumbilical hernia.  IMPRESSION: 1. Hepatic cirrhosis. 2. Cholelithiasis. 3. 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 Portal venous collaterals in the LEFT upper quadrant. 4. Nonobstructing LEFT renal calculi.   Electronically Signed   By: Andreas Newport M.D.   On: 10/22/2013 17:19   Dg Cholangiogram Operative  10/23/2013   CLINICAL DATA:  Gallbladder disease  EXAM: INTRAOPERATIVE CHOLANGIOGRAM  TECHNIQUE: Cholangiographic images from the C-arm fluoroscopic device were submitted for interpretation post-operatively. Please see the procedural report for the amount of contrast and the fluoroscopy time utilized.  COMPARISON:  None.  FINDINGS: Contrast fills the biliary tree and duodenum with small irregular filling defects in the distal common bile duct.  IMPRESSION: Patent biliary tree.  Possible debris or stones in the distal common bile duct.   Electronically Signed   By: Maryclare Bean M.D.   On: 10/23/2013 12:53   US Abdomen Limited  10/22/2013   CLINICAL DATA:  Epigastric abdominal pain radiating into the flanks.  EXAM: US ABDOMEN LIMITED - RIGHT UPPER QUADRANT  COMPARISON:  CT 10/22/2013 and multiple prior CT exams. Ultrasound 03/21/2013.  FINDINGS: Gallbladder:  Gallbladder wall measures 7 mm. In the setting of cirrhosis, this is a nonspecific finding. Sonographic Eulah Pont sign is reported.  Common bile duct:  Diameter: 7 mm, within normal limits for age. No common duct  stone is identified. The common bile duct size has increased compared 03/21/2013 ultrasound when it measured 4 mm.  Liver:  Hepatic cirrhosis.  No focal mass lesion is identified.  IMPRESSION: 1. Hepatic cirrhosis. 2. Gallbladder wall thickening is nonspecific in the setting of cirrhosis however in the presence of sonographic Murphy sign and gallstones on CT, these findings are suspicious for acute cholecystitis. No definite inflammatory changes were identified on CT today however CT is less sensitive than ultrasound for detection of acute cholecystitis. If the clinical scenario is equivocal, nuclear medicine  HIDA scan could be considered to assess for cholecystitis. 3. Increasing size of the common bile duct compared to prior exam earlier this year. No common duct stone is identified.   Electronically Signed   By: Dereck Ligas M.D.   On: 10/22/2013 17:21   Dg Chest Portable 1 View  10/22/2013   CLINICAL DATA:  Shortness of breath.  EXAM: PORTABLE CHEST - 1 VIEW  COMPARISON:  05/04/2013.  FINDINGS: Trachea is midline. Heart size stable. Lungs are somewhat low in volume with mild bibasilar volume loss. No airspace consolidation or pleural fluid.  IMPRESSION: Low lung volumes with mild bibasilar volume loss.   Electronically Signed   By: Lorin Picket M.D.   On: 10/22/2013 13:22    Medications / Allergies:  Scheduled Meds: . sodium chloride   Intravenous Once  . diphenhydrAMINE  25 mg Oral Once  . insulin aspart  0-15 Units Subcutaneous 6 times per day  . insulin glargine  15 Units Subcutaneous QHS  . pneumococcal 23 valent vaccine  0.5 mL Intramuscular Tomorrow-1000  . tamsulosin  0.4 mg Oral QPC supper   Continuous Infusions: . sodium chloride 1,000 mL (10/24/13 0037)  . lactated ringers     PRN Meds:.HYDROmorphone (DILAUDID) injection, ondansetron (ZOFRAN) IV, ondansetron (ZOFRAN) IV, ondansetron, ondansetron, oxyCODONE  Antibiotics: Anti-infectives   Start     Dose/Rate Route Frequency  Ordered Stop   10/22/13 1730  Ampicillin-Sulbactam (UNASYN) 3 g in sodium chloride 0.9 % 100 mL IVPB     3 g 100 mL/hr over 60 Minutes Intravenous  Once 10/22/13 1729 10/22/13 1834      Assessment/Plan Acute cholecystitis  S/p laparoscopic cholecystectomy with IOC--Dr. Georgette Dover 10/23/13 -change percocet to oxycodone may take 1-2 tablets q4h PRN -he is tolerating a diet, passing flatus and mobilizing.  Took percocet last night and tolerated well -may be discharged from surgical standpoint with 7 days of augmentin -he wishes to return to work on Monday.  He may return only if he does not strain or lift anything over 15-20lbs x3 weeks, a note was provided. -follow up arranged and instructions provided.  Erby Pian, [facility] Surgery Pager [phone]) For consults and floor pages call [phone]-[phone])  10/24/2013 8:35 AM

## 2013-10-24 NOTE — Progress Notes (Signed)
Pt discharged home with family. Pt verbalized understanding discharge instructions, medications, and follow-up appts. Pt reports that his pain and nausea has improved since medications were given. Pt denied any complaints or concerns at the time of discharge.

## 2013-10-24 NOTE — Progress Notes (Signed)
Spoke with MD concerning patient's pain level, new orders given, medicine given, tolerated well

## 2013-10-26 ENCOUNTER — Telehealth: Payer: Self-pay | Admitting: *Deleted

## 2013-10-26 NOTE — Telephone Encounter (Signed)
Pt left message on triage line to call him at [phone]. Talked a brief minute to pt and said he would call back - doctor was on line.  At 10:30AM left a message on same phone number [facility] was returning call. Another message left 11:25AM and last message left 1:30PM. 3PM and no word from pt. Hilda Blades Cloe Sockwell RN 10/26/13 3PM

## 2013-10-31 ENCOUNTER — Ambulatory Visit (HOSPITAL_COMMUNITY)
Admission: RE | Admit: 2013-10-31 | Discharge: 2013-10-31 | Disposition: A | Discharge status: Home / Self Care | Payer: Self-pay | Source: Ambulatory Visit | Attending: Internal Medicine | Admitting: Internal Medicine

## 2013-10-31 ENCOUNTER — Ambulatory Visit (INDEPENDENT_AMBULATORY_CARE_PROVIDER_SITE_OTHER): Payer: Self-pay | Admitting: Internal Medicine

## 2013-10-31 ENCOUNTER — Encounter: Payer: Self-pay | Admitting: Internal Medicine

## 2013-10-31 ENCOUNTER — Ambulatory Visit (INDEPENDENT_AMBULATORY_CARE_PROVIDER_SITE_OTHER): Payer: BC Managed Care – PPO | Admitting: General Surgery

## 2013-10-31 ENCOUNTER — Encounter (INDEPENDENT_AMBULATORY_CARE_PROVIDER_SITE_OTHER): Payer: Self-pay | Admitting: General Surgery

## 2013-10-31 VITALS — BP 132/64 | HR 70 | Temp 98.0°F | Ht 67.0 in | Wt 202.0 lb

## 2013-10-31 VITALS — BP 133/82 | HR 64 | Temp 97.2°F | Ht 67.0 in | Wt 204.5 lb

## 2013-10-31 DIAGNOSIS — K819 Cholecystitis, unspecified: Secondary | ICD-10-CM

## 2013-10-31 DIAGNOSIS — N400 Enlarged prostate without lower urinary tract symptoms: Secondary | ICD-10-CM

## 2013-10-31 DIAGNOSIS — R112 Nausea with vomiting, unspecified: Secondary | ICD-10-CM | POA: Insufficient documentation

## 2013-10-31 DIAGNOSIS — D696 Thrombocytopenia, unspecified: Secondary | ICD-10-CM

## 2013-10-31 DIAGNOSIS — K5900 Constipation, unspecified: Secondary | ICD-10-CM | POA: Insufficient documentation

## 2013-10-31 LAB — CBC WITH DIFFERENTIAL/PLATELET
BASOS ABS: 0 10*3/uL (ref 0.0–0.1)
Basophils Relative: 1 % (ref 0–1)
Eosinophils Absolute: 0.1 10*3/uL (ref 0.0–0.7)
Eosinophils Relative: 2 % (ref 0–5)
HCT: 38.8 % — ABNORMAL LOW (ref 39.0–52.0)
Hemoglobin: 13.7 g/dL (ref 13.0–17.0)
LYMPHS ABS: 0.9 10*3/uL (ref 0.7–4.0)
LYMPHS PCT: 19 % (ref 12–46)
MCH: 29.7 pg (ref 26.0–34.0)
MCHC: 35.3 g/dL (ref 30.0–36.0)
MCV: 84.2 fL (ref 78.0–100.0)
Monocytes Absolute: 0.5 10*3/uL (ref 0.1–1.0)
Monocytes Relative: 11 % (ref 3–12)
NEUTROS ABS: 3.1 10*3/uL (ref 1.7–7.7)
Neutrophils Relative %: 67 % (ref 43–77)
PLATELETS: 72 10*3/uL — AB (ref 150–400)
RBC: 4.61 MIL/uL (ref 4.22–5.81)
RDW: 14.5 % (ref 11.5–15.5)
WBC: 4.7 10*3/uL (ref 4.0–10.5)

## 2013-10-31 LAB — COMPLETE METABOLIC PANEL WITH GFR
ALT: 26 U/L (ref 0–53)
AST: 33 U/L (ref 0–37)
Albumin: 3 g/dL — ABNORMAL LOW (ref 3.5–5.2)
Alkaline Phosphatase: 175 U/L — ABNORMAL HIGH (ref 39–117)
BUN: 9 mg/dL (ref 6–23)
CALCIUM: 9.5 mg/dL (ref 8.4–10.5)
CO2: 27 mEq/L (ref 19–32)
CREATININE: 0.85 mg/dL (ref 0.50–1.35)
Chloride: 101 mEq/L (ref 96–112)
GFR, Est African American: >89 mL/min
GFR, Est Non African American: >89 mL/min
Glucose, Bld: 355 mg/dL — ABNORMAL HIGH (ref 70–99)
Potassium: 4.4 mEq/L (ref 3.5–5.3)
Sodium: 140 mEq/L (ref 135–145)
Total Bilirubin: 0.9 mg/dL (ref 0.2–1.2)
Total Protein: 7.3 g/dL (ref 6.0–8.3)

## 2013-10-31 MED ORDER — POLYETHYLENE GLYCOL 3350 17 G PO PACK: 17.0000 g | PACK | Freq: Two times a day (BID) | ORAL | Status: AC

## 2013-10-31 MED ORDER — HYDROMORPHONE HCL 4 MG PO TABS: 4.0000 mg | ORAL_TABLET | ORAL | Status: AC | PRN

## 2013-10-31 NOTE — Progress Notes (Signed)
Subjective:     Patient ID: Gloria Lambertson, male   DOB: [birth year], 58 y.o.   MRN: [id number]  HPI The patient is a 58 year old white male who is about 8 days status post laparoscopic cholecystectomy. He states that he has not had a bowel movement or passed gas since the surgery. He does report going to the emergency department a couple days after surgery where he got Enemas and a bottle of mag citrate and had a small bowel movement.  Review of Systems     Objective:   Physical Exam On exam his abdomen is soft and nontender. His incisions are all healing nicely with no sign of infection.    Assessment:     The patient is 8 days status post laparoscopic cholecystectomy with significant constipation     Plan:     At this point I would recommend that he stay well hydrated. He should begin taking a stool softener twice a day as well as MiraLAX daily. He may also tried an enema at home. He will need to followup in a week with Dr. Georgette Dover for a followup

## 2013-10-31 NOTE — Patient Instructions (Signed)
Drink plenty of fluid Colace twice a day miralax daily

## 2013-10-31 NOTE — Patient Instructions (Signed)
-  See [facility] today at 3:30 PM -Will check your labs and get an xray of your abdomen -I have refilled your dilaudid for pain, start taking colace or miralax for constipation  -Nice meeting you!  General Instructions:   Please bring your medicines with you each time you come to clinic.  Medicines may include prescription medications, over-the-counter medications, herbal remedies, eye drops, vitamins, or other pills.   Progress Toward Treatment Goals:  No flowsheet data found.  Self Care Goals & Plans:  No flowsheet data found.  No flowsheet data found.   Care Management & Community Referrals:  No flowsheet data found.

## 2013-10-31 NOTE — Assessment & Plan Note (Addendum)
Assessment: Pt with acute cholecystitis status post laparoscopic cholecystectomy on 8/18 (POD #8) compliant with narcotic therapy with absence of BM and no passage of gas since discharge concerning for obstruction,  Ileus, or constipation.   Plan:  -Appointment made with [facility] today at 3:30 PM -Obtain stat XR abdomen ----> diffuse stool without obstruction or ileus  -Obtain CMP and CBC w/diff ---> resolution of anemia, platlet count (72K) above baseline, normalization of AST/ALT normalized, and Alk Phos still mildly elevated (175)  -Prescribe dilaudid 4 mg Q 4 hr PRN and miralax 17 g BID -Pt to follow-up with PCP at [facility] in [place]

## 2013-10-31 NOTE — Assessment & Plan Note (Addendum)
Assessment: Pt with chronic thrombocytopenia with baseline platelet count 50K most likely due to chronic liver cirrhosis status post recent transfusion (2 units) before surgery who presents with no active bleeding.   Plan: -Obtain CBC w/ diff ---> platelet count 72 K above baseline

## 2013-10-31 NOTE — Assessment & Plan Note (Signed)
Assessment: Pt with suspected BPH recently started on medical therapy who presents with mildly improved obstructive and irritative symptoms.   Plan:  -Continue tamsulosin 0.4 mg daily  -Pt to follow-up with PCP at [facility] in [place] (consider checking PSA level)

## 2013-10-31 NOTE — Progress Notes (Signed)
Patient ID: Bradley Green, male   DOB: August 10, 1955, 58 y.o.   MRN: [id number]    Subjective:   Patient ID: Bradley Green male   DOB: [birth year] 58 y.o.   MRN: [id number]  HPI: [name] is a 58 y.o. pleasant man with past medical history of insulin-dependent Type II DM, liver cirrhosis, chronic thrombocytopenia, and BPH who presents for hospital follow-up visit.   He was hospitalized from 8/17-19 for acute cholecytsitis without bilary obstruction and is status post laparoscopic cholecystectomy on 8/18. He was instructed to take augmentin for 7 days which he reports finishing one day ago. He was also prescribed 4 mg dilaudid to take as needed every 4 hours which he has been taking twice a day. His abdominal pain located near lower trochar site and RUQ/epigastric region is constant 6/10  that is moderately well-controlled with dilaudid. He reports that since discharge he has not had a BM and is not passing gas with mild distension. He has been taking colace without relief. He denies fever, chills, nausea, vomiting, decreased appetite, or weight loss. He has been trying to eat a bland diet but yesterday had onions which he did not tolerate well.   He report he is urinating well and was started on flomax in the hospital due to suspected BPH which he has been compliant with.  He continues to have nocturia.  He was transfused 2 U of platelets before surgery during hospitalization. He reports no recent bleeding and has history of easy bruising.     Past Medical History  Diagnosis Date  . Kidney stones   . Diabetes mellitus   . Cirrhosis   . Adenomatous colon polyp    Current Outpatient Prescriptions  Medication Sig Dispense Refill  . diphenhydrAMINE (BENADRYL) 25 mg capsule Take 1 capsule (25 mg total) by mouth every 6 (six) hours as needed for itching.  15 capsule  0  . HYDROmorphone (DILAUDID) 4 MG tablet Take 1 tablet (4 mg total) by mouth every 4 (four) hours as needed for severe  pain.  18 tablet  0  . insulin aspart (NOVOLOG) 100 UNIT/ML injection Inject 30 Units into the skin 3 (three) times daily before meals.      . insulin glargine (LANTUS) 100 UNIT/ML injection Inject 60 Units into the skin 2 (two) times daily.       . ondansetron (ZOFRAN) 4 MG tablet Take 1 tablet (4 mg total) by mouth every 8 (eight) hours as needed for nausea.  10 tablet  0  . tamsulosin (FLOMAX) 0.4 MG CAPS capsule Take 1 capsule (0.4 mg total) by mouth daily after supper.  30 capsule  0   No current facility-administered medications for this visit.   Family History  Problem Relation Age of Onset  . Colon cancer Neg Hx    History   Social History  . Marital Status: Divorced    Spouse Name: N/A    Number of Children: 1  . Years of Education: N/A   Occupational History  . Dealer    Social History Main Topics  . Smoking status: Never Smoker   . Smokeless tobacco: Current User    Types: Chew     Comment: Info given on 03-29-11  . Alcohol Use: No  . Drug Use: No  . Sexual Activity: Not on file   Other Topics Concern  . Not on file   Social History Narrative  . No narrative on file   Review of Systems: Review of Systems  Constitutional: Negative for fever, chills and weight loss.  Respiratory: Positive for cough (occasionally) and wheezing (occasionally).   Cardiovascular: Negative for chest pain and leg swelling.  Gastrointestinal: Positive for constipation (since discharge). Negative for nausea, vomiting, diarrhea and blood in stool.  Genitourinary: Negative for dysuria, urgency and frequency.  Musculoskeletal: Negative for myalgias.  Endo/Heme/Allergies: Bruises/bleeds easily.  Psychiatric/Behavioral: The patient has insomnia.      Objective:  Physical Exam: Filed Vitals:   10/31/13 1015  BP: 133/82  Pulse: 64  Temp: 97.2 F (36.2 C)  TempSrc: Oral  Height: 5\' 7"  (1.702 m)  Weight: 204 lb 8 oz (92.761 kg)  SpO2: 100%    Physical Exam  Constitutional:  He is oriented to person, place, and time. He appears well-developed and well-nourished. No distress.  HENT:  Head: Atraumatic.  Right Ear: External ear normal.  Eyes: EOM are normal. Right eye exhibits no discharge. Left eye exhibits no discharge. No scleral icterus.  Neck: Normal range of motion. Neck supple.  Cardiovascular: Normal rate, regular rhythm and normal heart sounds.   Pulmonary/Chest: Effort normal and breath sounds normal. No respiratory distress. He has no wheezes. He has no rales.  Abdominal: Bowel sounds are normal. He exhibits no distension. There is tenderness (below umbilicus and RUQ/epigastric region). There is guarding (voluntary). There is no rebound.  Well-healing trochar sites  Musculoskeletal: Normal range of motion. He exhibits no edema and no tenderness.  Neurological: He is alert and oriented to person, place, and time.  Skin: Skin is warm and dry. No rash noted. He is not diaphoretic. No erythema. No pallor.  Psychiatric: He has a normal mood and affect. His behavior is normal. Judgment and thought content normal.    Assessment & Plan:   Please see problem list for problem-based assessment and plan

## 2013-11-02 ENCOUNTER — Telehealth (INDEPENDENT_AMBULATORY_CARE_PROVIDER_SITE_OTHER): Payer: Self-pay

## 2013-11-02 NOTE — Telephone Encounter (Signed)
Pt wants to return to work 11-05-13. Pt was given letter by PA but restrictions lasted until 11-19-13. Pt states he cannot return to work with restrictions. Pt states he does not get paid if he does not work. Pt wants to return without restrictions on 11-05-13. Pt advised this request will be sent to Dr Georgette Dover to review. Pt can be reached at [phone].

## 2013-11-05 ENCOUNTER — Encounter (INDEPENDENT_AMBULATORY_CARE_PROVIDER_SITE_OTHER): Payer: Self-pay

## 2013-11-05 NOTE — Progress Notes (Signed)
INTERNAL MEDICINE TEACHING ATTENDING ADDENDUM - Laruth Hanger, MD: I reviewed and discussed at the time of visit with the resident Dr. Rabbani, the patient's medical history, physical examination, diagnosis and results of pertinent tests and treatment and I agree with the patient's care as documented.  

## 2013-11-19 ENCOUNTER — Encounter (INDEPENDENT_AMBULATORY_CARE_PROVIDER_SITE_OTHER): Payer: Self-pay | Admitting: Surgery

## 2014-10-03 ENCOUNTER — Other Ambulatory Visit: Payer: Self-pay | Admitting: Internal Medicine

## 2014-10-03 DIAGNOSIS — R1900 Intra-abdominal and pelvic swelling, mass and lump, unspecified site: Secondary | ICD-10-CM

## 2014-10-15 ENCOUNTER — Ambulatory Visit
Admission: RE | Admit: 2014-10-15 | Discharge: 2014-10-15 | Disposition: A | Discharge status: Home / Self Care | Payer: Non-veteran care | Source: Ambulatory Visit | Attending: Internal Medicine | Admitting: Internal Medicine

## 2014-10-15 DIAGNOSIS — R1900 Intra-abdominal and pelvic swelling, mass and lump, unspecified site: Secondary | ICD-10-CM

## 2014-10-15 MED ORDER — GADOXETATE DISODIUM 0.25 MMOL/ML IV SOLN
9.0000 mL | Freq: Once | INTRAVENOUS | Status: AC | PRN
  Administered 2014-10-15: 9 mL via INTRAVENOUS

## 2015-11-07 DEATH — deceased

## 2017-05-28 IMAGING — MR MR ABDOMEN WO/W CM
7 of 17 series · 23 of 48 positions shown · IV contrast (09 CC EOVIST)
Comparison: Most recent CT abdomen/ pelvis from 09/19/2014.
02/08/2010 MRI abdomen.

CLINICAL DATA: 58-year-old male with cirrhosis and indeterminate
liver nodule.

EXAM:
MRI ABDOMEN WITHOUT AND WITH CONTRAST
TECHNIQUE: Multiplanar multisequence MR imaging of the abdomen was performed
both before and after the administration of intravenous contrast.
BUN and creatinine were obtained on site at [HOSPITAL] at
[HOSPITAL].
Results:  BUN 9 mg/dL,  Creatinine 0.9 mg/dL.
CONTRAST:  9 cc Eovist IV.

[Series 5: axial in out · axial · 6.0mm · 0.78mm/px · z∈[-126,+96]mm · 4 of 76 slices shown]
[im 1/76]
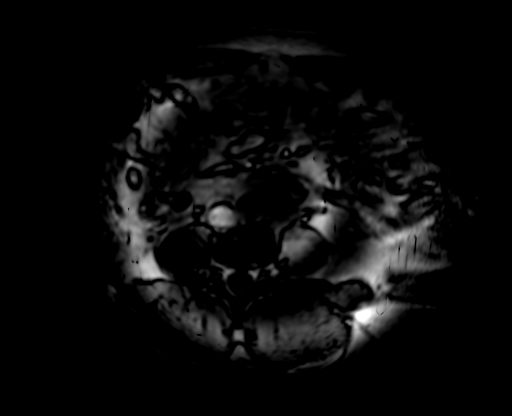
[im 26/76]
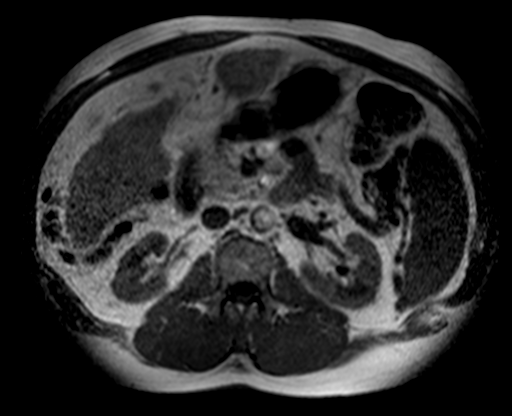
[im 51/76]
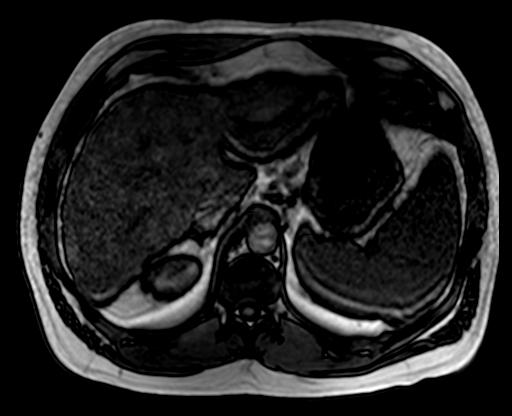
[im 76/76]
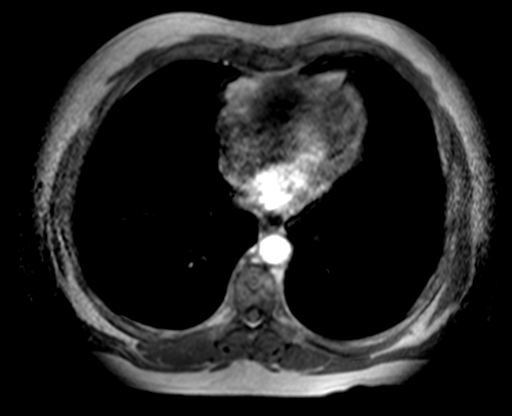

[Series 6: T2 · coronal · 5.0mm · 1.48mm/px · 2 of 37 slices shown]
[im 1/37]
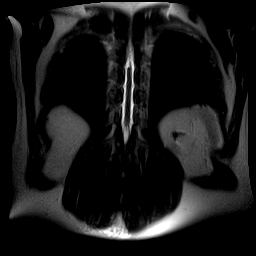
[im 37/37]
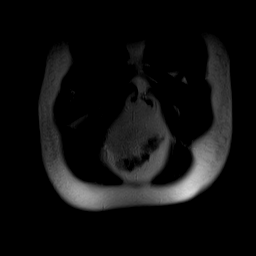

[Series 7: T1 dynamic · axial · non-contrast · 2.2mm · 0.78mm/px · z∈[-128,+98]mm · 4 of 104 slices shown]
[im 1/104]
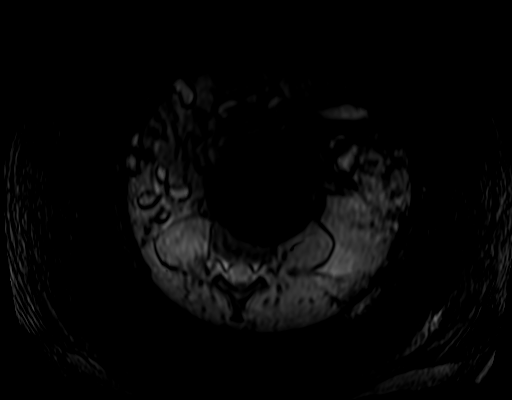
[im 35/104]
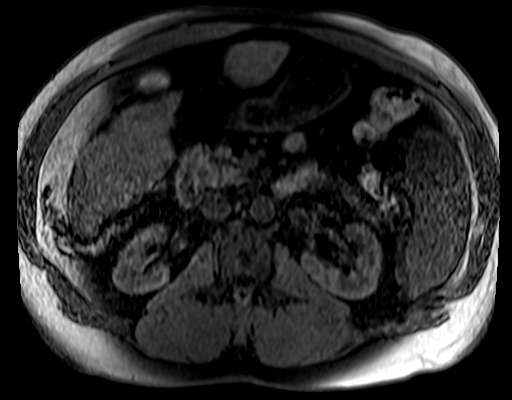
[im 69/104]
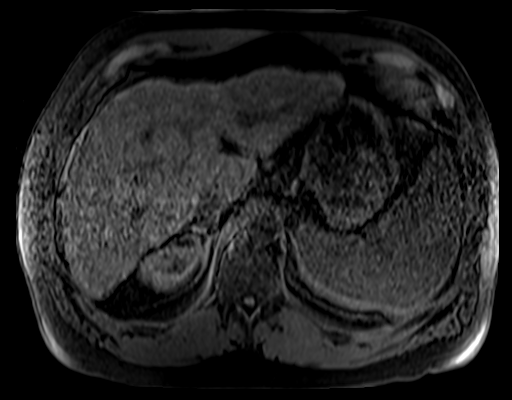
[im 104/104]
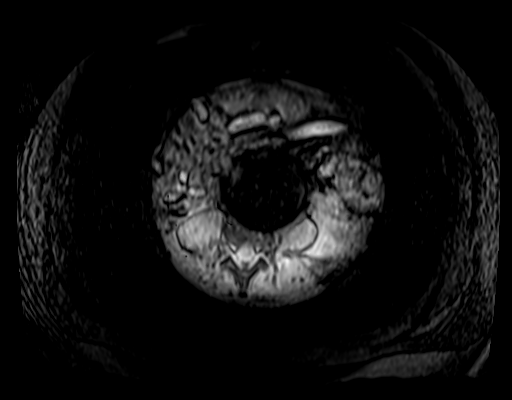

[Series 8: post 25 sec · axial · 2.2mm · 0.78mm/px · z∈[-128,+98]mm · 4 of 104 slices shown]
[im 1/104]
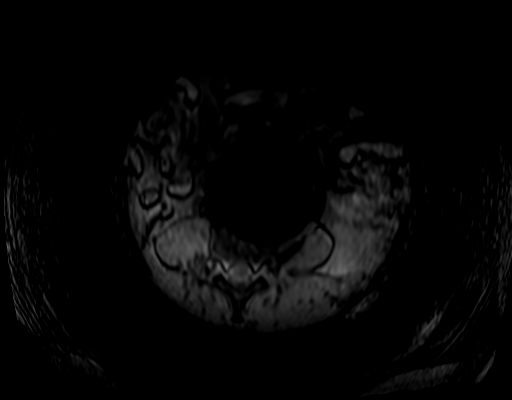
[im 35/104]
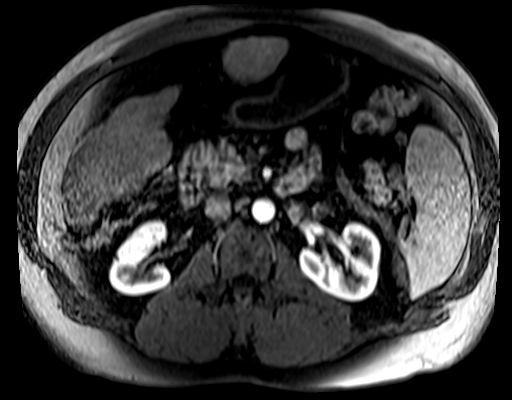
[im 69/104]
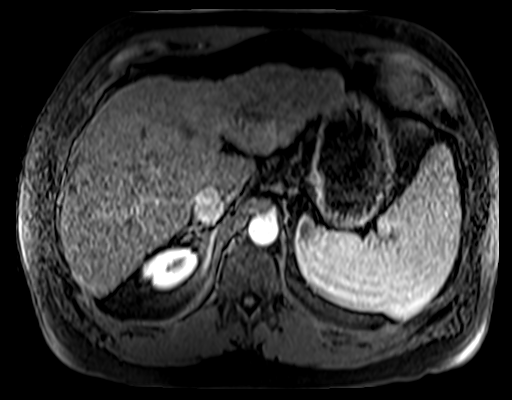
[im 104/104]
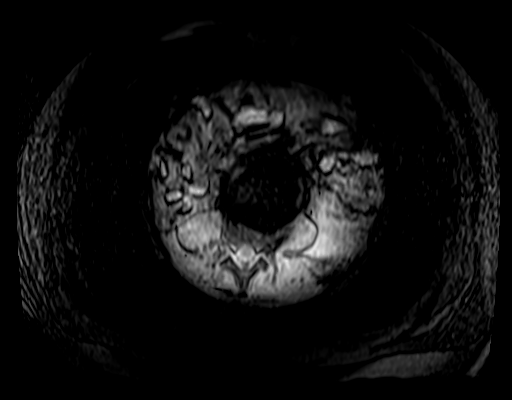

[Series 9: post 25 sec_sub · axial · 2.2mm · 0.78mm/px · z∈[-128,+98]mm · 4 of 104 slices shown]
[im 1/104]
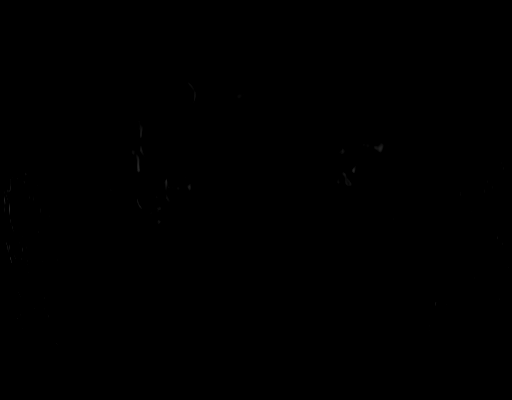
[im 35/104]
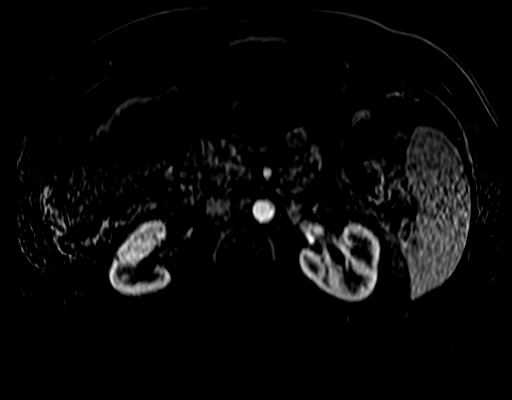
[im 69/104]
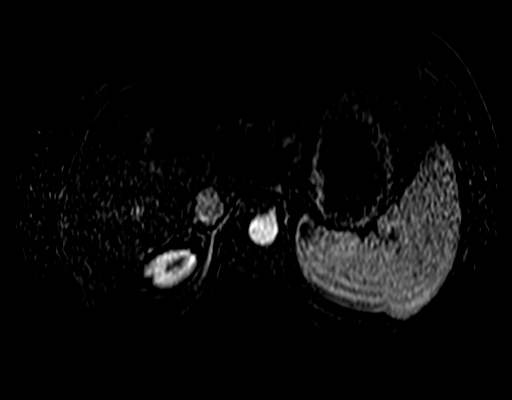
[im 104/104]
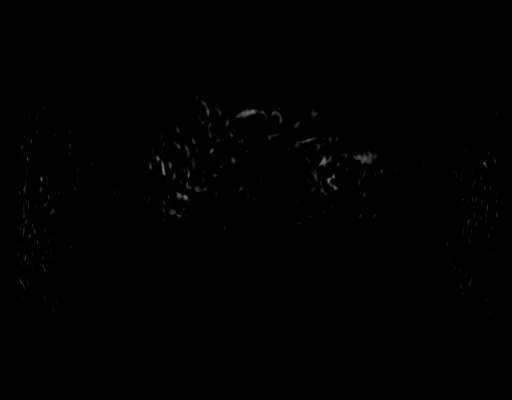

[Series 10: post 45 sec · axial · 2.2mm · 0.78mm/px · z∈[-128,+98]mm · 4 of 104 slices shown]
[im 1/104]
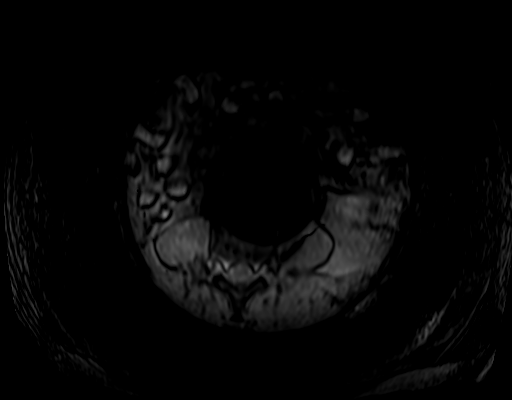
[im 35/104]
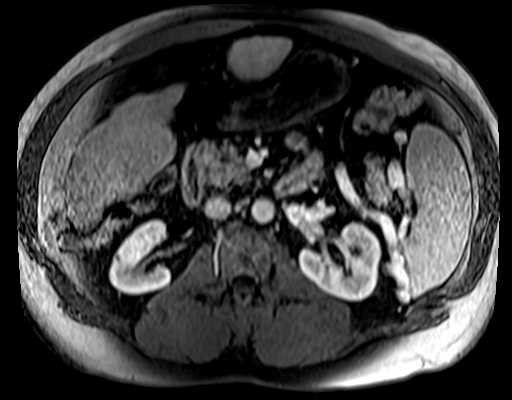
[im 69/104]
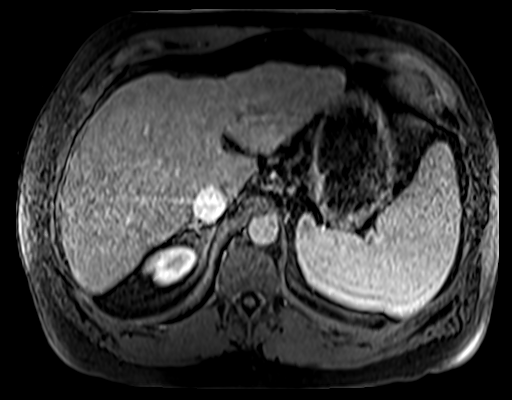
[im 104/104]
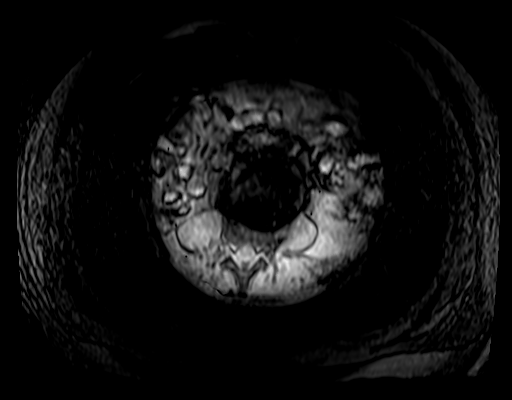

[Series 11: post 45 sec_sub · axial · 2.2mm · 0.78mm/px · 1 of 104 slices shown]
[im 1/104]
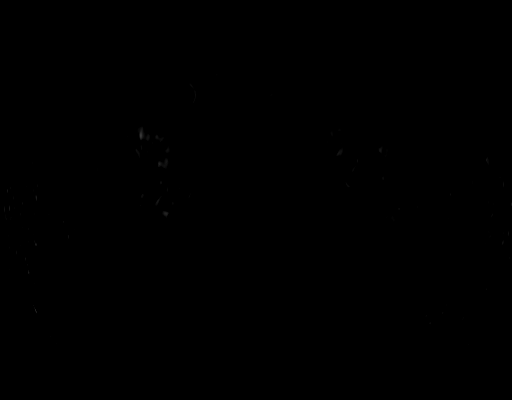

[23 of 48 positions shown; findings below may reference images not displayed]

FINDINGS: Lower chest:  Clear lung bases.

Hepatobiliary: Liver surface is diffusely irregular, in keeping with
cirrhosis. There is a stable simple 5 mm cyst in segment 8. There
are several (at least 8) small T1 hyperintense and T2 hypointense
liver nodules scattered throughout the liver, measuring up to 1.3 cm
in segment 4A (series 5/image 12), none of which demonstrate
arterial phase hyper enhancement or washout, in keeping with
dysplastic nodules. There are no foci of arterial phase
hyperenhancement. No suspicious liver lesions. There is no
detectable liver mass at MRI to correlate with the 1.8 cm hypodense
focus described in the inferior right liver lobe on the 09/19/2014
CT study, which likely represents an area of signal heterogeneity
from fibrosis. Mild diffuse hepatic steatosis.

Pancreas: There are simple unilocular nonenhancing pancreatic cystic
lesions measuring 5 mm in the uncinate process (series 6/ image 21),
5 mm in the pancreatic head ([DATE], 6 mm in the pancreatic body
([DATE]), in keeping with nonaggressive pancreatic cystic lesions,
which are new since 02/08/2010 MRI. A 5 mm unilocular nonenhancing
pancreatic cystic lesion in the pancreatic tail ([DATE]) is stable
since 02/08/2010 MRI, in keeping with a benign nonaggressive cystic
pancreatic lesion. Otherwise normal pancreas, with no pancreatic
mass, and no main pancreatic duct dilation and no evidence of
pancreas divisum.

Spleen: Moderate splenomegaly (craniocaudal splenic length 17.1 cm).
No splenic mass.

Adrenals/Urinary Tract: Normal adrenals. There are a few scattered
subcentimeter simple renal cortical cysts in both kidneys. There are
a few small simple peripelvic renal cysts in the lower left renal
sinus. No suspicious renal lesions. No hydronephrosis.

Stomach/Bowel: Grossly normal stomach. Visualized small and large
bowel are normal caliber.

Vascular/Lymphatic: Hepatic arterial anatomy is conventional. Patent
portal, splenic, hepatic and renal veins. There are small
gastroesophageal varices. There is a large left splenorenal shunt.
Normal caliber abdominal aorta. There are no pathologically enlarged
abdominal lymph nodes. A 1.1 cm short axis portacaval node and a
cm short axis porta hepatis node are within normal limits.

Other: No ascites.  No focal intra-abdominal fluid collection.

Musculoskeletal: No gross suspicious focal osseous lesions.
IMPRESSION: 1. Cirrhosis. No liver masses suspicious for hepatocellular
carcinoma. Multiple small dysplastic liver nodules. Routine liver
surveillance is advised.
2. Moderate splenomegaly. No ascites. Small gastroesophageal varices
enlarged left splenorenal shunt.
3. Subcentimeter nonaggressive pancreatic cystic lesions, largest 6
mm. A single follow-up MRI abdomen in 12 months is recommended to
assess stability of these probably benign lesions. This
recommendation follows ACR consensus guidelines: Managing Incidental
Findings on Abdominal CT: White Paper of the ACR Incidental Findings
Committee. [HOSPITAL] [id number];[DATE]

## 2019-04-11 ENCOUNTER — Telehealth: Payer: Self-pay | Admitting: *Deleted

## 2019-04-13 NOTE — Telephone Encounter (Signed)
Encounter opened in error
